# Patient Record
Sex: Female | Born: 1955 | Race: White | Hispanic: No | Smoking: Current every day smoker
Health system: Southern US, Community
[De-identification: ages and names within clinical notes are randomized; demographics above are authoritative.]

## PROBLEM LIST (undated history)

## (undated) DIAGNOSIS — M1990 Unspecified osteoarthritis, unspecified site: Secondary | ICD-10-CM

## (undated) DIAGNOSIS — R011 Cardiac murmur, unspecified: Secondary | ICD-10-CM

## (undated) DIAGNOSIS — E119 Type 2 diabetes mellitus without complications: Secondary | ICD-10-CM

## (undated) DIAGNOSIS — K259 Gastric ulcer, unspecified as acute or chronic, without hemorrhage or perforation: Secondary | ICD-10-CM

## (undated) DIAGNOSIS — F32A Depression, unspecified: Secondary | ICD-10-CM

## (undated) DIAGNOSIS — J302 Other seasonal allergic rhinitis: Secondary | ICD-10-CM

## (undated) HISTORY — PX: APPENDECTOMY: SHX54

## (undated) HISTORY — PX: CHOLECYSTECTOMY: SHX55

## (undated) HISTORY — PX: ABDOMINAL HYSTERECTOMY: SHX81

## (undated) HISTORY — PX: HERNIA REPAIR: SHX51

## (undated) HISTORY — PX: KNEE SURGERY: SHX244

## (undated) HISTORY — DX: Gastric ulcer, unspecified as acute or chronic, without hemorrhage or perforation: K25.9

---

## 2001-12-27 ENCOUNTER — Emergency Department (HOSPITAL_COMMUNITY): Admission: EM | Admit: 2001-12-27 | Discharge: 2001-12-27 | Payer: Self-pay | Admitting: *Deleted

## 2001-12-29 ENCOUNTER — Other Ambulatory Visit: Admission: RE | Admit: 2001-12-29 | Discharge: 2001-12-29 | Payer: Self-pay | Admitting: *Deleted

## 2001-12-30 ENCOUNTER — Ambulatory Visit (HOSPITAL_COMMUNITY): Admission: RE | Admit: 2001-12-30 | Discharge: 2001-12-30 | Payer: Self-pay | Admitting: *Deleted

## 2002-03-27 ENCOUNTER — Emergency Department (HOSPITAL_COMMUNITY): Admission: EM | Admit: 2002-03-27 | Discharge: 2002-03-27 | Payer: Self-pay | Admitting: Emergency Medicine

## 2002-03-27 ENCOUNTER — Ambulatory Visit (HOSPITAL_COMMUNITY): Admission: RE | Admit: 2002-03-27 | Discharge: 2002-03-27 | Payer: Self-pay | Admitting: *Deleted

## 2002-04-21 ENCOUNTER — Ambulatory Visit (HOSPITAL_COMMUNITY): Admission: RE | Admit: 2002-04-21 | Discharge: 2002-04-21 | Payer: Self-pay | Admitting: Family Medicine

## 2002-04-21 ENCOUNTER — Encounter: Payer: Self-pay | Admitting: Family Medicine

## 2002-05-21 ENCOUNTER — Encounter: Payer: Self-pay | Admitting: *Deleted

## 2002-05-21 ENCOUNTER — Inpatient Hospital Stay (HOSPITAL_COMMUNITY): Admission: RE | Admit: 2002-05-21 | Discharge: 2002-05-23 | Payer: Self-pay | Admitting: General Surgery

## 2003-10-07 ENCOUNTER — Emergency Department (HOSPITAL_COMMUNITY): Admission: EM | Admit: 2003-10-07 | Discharge: 2003-10-07 | Payer: Self-pay | Admitting: Emergency Medicine

## 2003-11-05 ENCOUNTER — Observation Stay (HOSPITAL_COMMUNITY): Admission: RE | Admit: 2003-11-05 | Discharge: 2003-11-06 | Payer: Self-pay | Admitting: General Surgery

## 2004-02-28 ENCOUNTER — Ambulatory Visit (HOSPITAL_COMMUNITY): Admission: RE | Admit: 2004-02-28 | Discharge: 2004-02-28 | Payer: Self-pay | Admitting: Family Medicine

## 2005-04-16 ENCOUNTER — Emergency Department (HOSPITAL_COMMUNITY): Admission: EM | Admit: 2005-04-16 | Discharge: 2005-04-16 | Payer: Self-pay | Admitting: Emergency Medicine

## 2006-11-30 ENCOUNTER — Emergency Department (HOSPITAL_COMMUNITY): Admission: EM | Admit: 2006-11-30 | Discharge: 2006-11-30 | Payer: Self-pay | Admitting: Emergency Medicine

## 2007-06-25 ENCOUNTER — Ambulatory Visit: Payer: Self-pay | Admitting: Internal Medicine

## 2007-07-09 ENCOUNTER — Encounter: Payer: Self-pay | Admitting: Internal Medicine

## 2007-07-09 ENCOUNTER — Ambulatory Visit (HOSPITAL_COMMUNITY): Admission: RE | Admit: 2007-07-09 | Discharge: 2007-07-09 | Payer: Self-pay | Admitting: Internal Medicine

## 2007-07-09 ENCOUNTER — Ambulatory Visit: Payer: Self-pay | Admitting: Internal Medicine

## 2007-08-06 ENCOUNTER — Ambulatory Visit (HOSPITAL_COMMUNITY): Admission: RE | Admit: 2007-08-06 | Discharge: 2007-08-06 | Payer: Self-pay | Admitting: General Surgery

## 2008-06-23 ENCOUNTER — Ambulatory Visit (HOSPITAL_COMMUNITY): Admission: RE | Admit: 2008-06-23 | Discharge: 2008-06-23 | Payer: Self-pay | Admitting: Family Medicine

## 2010-07-26 ENCOUNTER — Emergency Department (HOSPITAL_COMMUNITY)
Admission: EM | Admit: 2010-07-26 | Discharge: 2010-07-26 | Disposition: A | Discharge status: Home / Self Care | Payer: BLUE CROSS/BLUE SHIELD | Attending: Emergency Medicine | Admitting: Emergency Medicine

## 2010-07-26 ENCOUNTER — Emergency Department (HOSPITAL_COMMUNITY): Payer: BLUE CROSS/BLUE SHIELD

## 2010-07-26 DIAGNOSIS — M79609 Pain in unspecified limb: Secondary | ICD-10-CM | POA: Insufficient documentation

## 2010-07-26 DIAGNOSIS — M129 Arthropathy, unspecified: Secondary | ICD-10-CM | POA: Insufficient documentation

## 2010-07-26 DIAGNOSIS — M25539 Pain in unspecified wrist: Secondary | ICD-10-CM | POA: Insufficient documentation

## 2010-08-09 ENCOUNTER — Other Ambulatory Visit (HOSPITAL_COMMUNITY): Payer: Self-pay | Admitting: Family Medicine

## 2010-08-09 ENCOUNTER — Ambulatory Visit (HOSPITAL_COMMUNITY)
Admission: RE | Admit: 2010-08-09 | Discharge: 2010-08-09 | Disposition: A | Discharge status: Home / Self Care | Payer: BLUE CROSS/BLUE SHIELD | Source: Ambulatory Visit | Attending: Family Medicine | Admitting: Family Medicine

## 2010-08-09 DIAGNOSIS — Z0189 Encounter for other specified special examinations: Secondary | ICD-10-CM | POA: Insufficient documentation

## 2010-08-09 DIAGNOSIS — Z01419 Encounter for gynecological examination (general) (routine) without abnormal findings: Secondary | ICD-10-CM

## 2010-08-29 NOTE — Op Note (Signed)
NAME:  Ashley Serrano, Ashley Serrano               ACCOUNT NO.:  [id number]   MEDICAL RECORD NO.:  [id number]          PATIENT TYPE:  AMB   LOCATION:  DAY                           FACILITY:  [facility]   PHYSICIAN:  R. Roetta Sessions, M.D. DATE OF BIRTH:  1955-11-27   DATE OF PROCEDURE:  07/09/2007  DATE OF DISCHARGE:                               OPERATIVE REPORT   PROCEDURE:  Diagnostic esophagogastroduodenoscopy followed by  ileocolonoscopy with biopsy.   INDICATIONS FOR PROCEDURE:  A 55 year old lady with hemoccult positive  stool last year for which she was offered colonoscopy but failed to  follow through until now.  She has also had some intermittent epigastric  pain and H pylori infection previously treated.  EGD and colonoscopy are  now being done.  This approach has been discussed with the patient at  length.  Potential risks, benefits and alternatives have been reviewed,  questions answered.  She is agreeable.  Please see documentation in the  medical record.   PROCEDURE NOTE:  O2 saturation, blood pressure, pulse and respirations  were monitored throughout the entire procedure.   CONSCIOUS SEDATION:  Versed 8 mg IV, Demerol 125 mg IV in divided doses.  Cetacaine spray for topical pharyngeal anesthesia.   INSTRUMENT:  Pentax video chip system.   FINDINGS:  EGD:  Examination of tubular esophagus revealed noncritical  Schatzki's ring and tiny distal esophageal erosions.  Esophageal mucosa  was otherwise unremarkable.  The EG junction easily traversed.   Stomach:  Gastric cavity was emptied and insufflated well with air.  Thorough examination of the gastric mucosa including retroflexed view of  the proximal stomach and esophagogastric junction demonstrated only a  small hiatal hernia.  Pylorus was patent and easily traversed.  Examination of the bulb and second portion revealed a couple of tiny  bulbar erosions.  Otherwise the mucosa of D1 and D2 appeared entirely  normal.   THERAPEUTIC/DIAGNOSTIC MANEUVERS PERFORMED:  None.   The patient tolerated procedure well.   Colonoscopy:  Digital rectal exam revealed no abnormalities.   ENDOSCOPIC FINDINGS:  The prep was adequate.   Colon:  Colonic mucosa was surveyed from the rectosigmoid junction,  through the left, transverse and right colon to the area of appendiceal  orifice, ileocecal valve and cecum.  These structures were seen and  photographed for the record.  Terminal ileum was intubated to 5 cm.  From this level, the scope was slowly withdrawn.  All previously  mentioned mucosal surfaces were again seen.  The patient had a somewhat  tortuous elongated colon.  The patient had scattered left-sided  diverticula.  There was a 2 mm polyp at the base of the cecum which was  cold biopsy/removed.  Remainder of the colon mucosa appeared  unremarkable.  Scope was pulled down in the rectum where thorough  examination of the rectal mucosa including retroflexed view of the anal  verge demonstrated the anal papilla and some internal hemorrhoids only.  The patient tolerated both procedures well as reactive to endoscopy.   IMPRESSION:  1. Tiny distal esophageal erosions consistent with mild erosive reflux  esophagitis, noncritical Schatzki's ring, not manipulated otherwise      unremarkable esophagus.  2. Small hiatal hernia, otherwise normal stomach, patent pylorus, a      couple of bulbar erosions.  Otherwise D1 and D2 appeared normal.  3. Colonoscopy findings:  Anal papilla and internal hemorrhoids,      otherwise normal rectum, left-sided diverticula, diminutive cecal      polyp status post cold biopsy removal.  Remainder of colonic mucosa      and terminal ileal mucosa appeared normal.   RECOMMENDATIONS:  1. Begin Prilosec 20 mg orally daily for gastroesophageal reflux      disease.  Reflux, hiatal hernia and diverticulosis and  polyp      literature provided Ms. Melgoza.  2. Follow-up on path.  3.  Further recommendations to follow.      Jonathon Bellows, M.D.  Electronically Signed     RMR/MEDQ  D:  07/09/2007  T:  07/09/2007  Job:  [id number]

## 2010-08-29 NOTE — Op Note (Signed)
NAME:  Ashley Serrano, WIMBISH NO.:  [id number]   MEDICAL RECORD NO.:  [id number]          PATIENT TYPE:  AMB   LOCATION:  DAY                           FACILITY:  [facility]   PHYSICIAN:  Tilford Pillar, MD      DATE OF BIRTH:  03-Jul-1955   DATE OF PROCEDURE:  08/06/2007  DATE OF DISCHARGE:                               OPERATIVE REPORT   PREOPERATIVE DIAGNOSIS:  Recurrent incisional hernia.   POSTOPERATIVE DIAGNOSIS:  Recurrent incisional hernia.   PROCEDURE:  Laparoscopic herniorrhaphy with mesh (15 x 20 cm Proceed  mesh).   SURGEON:  Tilford Pillar, MD   ANESTHESIA:  General endotracheal, local anesthetic 1% Sensorcaine  plain.   SPECIMEN:  None.   ESTIMATED BLOOD LOSS:  Scant.   INDICATIONS:  The patient is a 55 year old female who presented to my  office as an outpatient with a notable bulging above her umbilicus.  She  had had a previous midline incision and had developed a hernia.  This  had previously been repaired by another surgeon with the utilization of  a mesh from an anterior approach.  This was approximately 2 years ago.  She states she did well following, but then had over the last several  months, again noted some bulging in this area.  The risks, benefits, and  alternatives of a laparoscopic versus open anterior hernia repair were  discussed with the patient at length, including but not limited to the  risk of bleeding, infection, infection of the mesh, as well as  possibility of underlying bowel injury, as well as possibility of  intraoperative pulmonary or cardiac event.  The patient's questions and  concerns were addressed, and the patient was consented for a planned  laparoscopic ventral recurrent incisional hernia repair.  The patient is  aware that this typically does have more postoperative pain than an  anterior approach, but understands the benefits of the procedure as well  and a recurrent incisional hernia.   OPERATION:  The patient was  taken to the operating room and was placed  in a supine position on the operating table, at which time the general  anesthetic was administered.  Once the patient was asleep, she was  endotracheally intubated by anesthesia.  At this point, the patient's  abdomen was prepped and draped in the usual fashion.  A stab incision  was created at the left upper quadrant and the left subcostal margin at  Palmer's point.  A Veress needle was inserted.  Saline drop test was  utilized to confirm intraperitoneal placement, and then pneumoperitoneum  was initiated.  Once sufficient pneumoperitoneum was obtained, a left  lateral 11-mm trocar was placed, creating a skin incision and placement  of the trocar over the laparoscope allowing visualization of trocar  entering into the peritoneal cavity.  At this point, the inner cannula  was removed.  The laparoscope was reinserted.  There was no evidence of  any Veress or trocar placement injury, and at this time, a 5-mm trocar  was placed to the superior umbilical side.  A stab incision and  placement of  trocar under direct visualization, a 30-degree scope was  utilized for allowing re-visualization of the abdominal wall.  There  were several multiple adhesions along the anterior aspect, especially in  the midline of the abdomen.  There were also multiple adhesions in the  lower abdomen, which were well away from the suspected hernia, and these  were left well alone.  At this point, a combination of blunt and sharp  dissection with an EndoShears and Harmonic scalpel dissection was  utilized to dissect the omental adhesions off the anterior abdominal  wall.  There was a loop of transverse colon also adherent.  This was  sharply dissected free without any difficulty and without any injury.  With continued dissection, mesh exposure was created on the anterior  abdominal wall throughout positioning of a third trocar.  This was a 5-  mm trocar, which was placed in  the right lateral abdominal wall.  This  was placed similarly with a skin incision and placed in the trocar under  direct visualization.  At this point, continued dissection was  undertaken until the hernia defect was circumferentially __________ and  a portion of the falciform ligament was also dissected allowing  additional area for planned pexy of the mesh to the anterior abdominal  wall.  With adequate circumferential clearance around the hernia,  palpation of the abdominal wall was utilized to mark the plain sites of  pexy sutures.  These were placed in the four quadrants around the hernia  defect.  The pneumoperitoneum was then evacuated allowing accurate  measurement, the measurement was approximately 19 cm in a caudal to the  cranial direction and roughly 15 cm in a left-to-right lateral  direction.  At this point, a pneumoperitoneum was reestablished.  A 20 x  15 cm piece of __________ mesh was brought to the field, 2-0 novafil  silk suture was utilized with the placement of pexy sutures at the four  quadrants of the mesh, the mesh was marked with appropriate orientation  for the placement with a marking pen and then was rolled in and inserted  through the 11-mm trocar site.  At this point, the laparoscope was  reinserted.  The mesh was positioned using a stab incision in the  previously marked site.  The EndoClose suture passing device was  utilized to pass the suture in the peritoneal cavity through the  abdominal wall.  These sutures were tagged with a hemostat.  Once all 4  pexy sutures were placed through the anterior abdominal wall, the  sutures were pulled  bringing the mesh against the anterior abdominal  wall, had excellent coverage over the hernia defect.  However, the mesh  was slightly loose in a caudal-to-cranial direction.  Therefore, the  inferior pexy sutures were again pulled back to the anterior abdominal  wall and repositioned approximately 2 cm more inferiorly.   Again, passed  past the anterior abdominal wall with the EndoClose suture passing  device.  With these sutures in place, they were again pulled __________  and the  mesh now had excellent position, I was quite pleased with  orientation and the pexy sutures were inserted.  At that time, a spiral  __________  catheter was brought to the field and was utilized to tack  the mesh circumferentially to the anterior abdominal wall, and the mesh  was in excellent position.  Hemostasis was excellent.  Pneumoperitoneum  was evacuated.  Trocars were removed.  Local anesthetic was instilled,  and then a 4-0  Monocryl was utilized to reapproximate the skin edges at  the three trocar sites and was then washed and dried with a moist dry  towel.  Benzoin was applied around all stab incisions, including the  trocar sites.  Half-inch Steri-Strips were placed over all incisions.  The drapes were removed.  The patient was allowed to come out of general  anesthetic.  She was transferred back to regular hospital bed.  She was  transferred to Postanesthetic Care Unit in stable condition.  At the  conclusion of the procedure, all instruments, sponges, and needle counts  were correct.  The patient tolerated the procedure well.      Tilford Pillar, MD  Electronically Signed     BZ/MEDQ  D:  08/06/2007  T:  08/07/2007  Job:  [id number]   cc:   Kirk Ruths, M.D.  Fax: [phone]

## 2010-08-29 NOTE — H&P (Signed)
NAME:  Ashley Serrano, Ashley Serrano NO.:  [id number]   MEDICAL RECORD NO.:  [id number]          PATIENT TYPE:  AMB   LOCATION:  DAY                           FACILITY:  [facility]   PHYSICIAN:  Tilford Pillar, MD      DATE OF BIRTH:  July 18, 1955   DATE OF ADMISSION:  DATE OF DISCHARGE:  LH                              HISTORY & PHYSICAL   CHIEF COMPLAINT:  Abdominal hernia.   HISTORY OF PRESENT ILLNESS:  The patient is a 55 year old female who  presented to my office as an outpatient with a notable bulge, just above  her umbilicus.  She has had multiple abdominal surgeries in the past and  has actually had a prior incisional hernia at the same site, which was  repaired from an anterior approach with mesh, approximately 2 years ago.  She states she had done well prior to this but then noted a slow  increase in the size of the bulging in this area again.  She has had no  change in bowel function.  She has had no fever or chills.  She has had  no signs or symptoms consistent with incarceration or strangulation.  Again, this has been increasing slowly in size over the last one year.  She has not noticed any other bulges or masses.   PAST MEDICAL HISTORY:  1. Gastroesophageal reflux disease.  2. Diagnosed hiatal hernia.   PAST SURGICAL HISTORY:  1. Open cholecystectomy.  2. Hysterectomy.  3. Multiple abdominal hernia repairs.  4. Open appendectomy.   MEDICATIONS:  She is on omeprazole.   ALLERGIES:  No known drug allergies.   SOCIAL HISTORY:  She is a 1-pack per day smoker.  She denies any alcohol  or recreational drug use.   OCCUPATION:  She works as a Armed forces logistics/support/administrative officer at [employer].   FAMILY HISTORY:  No significant pertinent family medical history is  obtained.   REVIEW OF SYSTEMS:  CONSTITUTIONAL:  Unremarkable.  EYES:  Unremarkable.  EARS, NOSE, AND THROAT:  Occasional rhinorrhea, especially with seasonal  allergies.  RESPIRATORY:  Unremarkable.  CARDIOVASCULAR:   Unremarkable.  GASTROINTESTINAL:  Positive for heartburn, otherwise unremarkable.  GENITOURINARY:  Unremarkable.  MUSCULOSKELETAL:  Unremarkable.  SKIN:  Unremarkable.  ENDOCRINE:  She does have occasional heat intolerance.  NEURO:  Unremarkable.   PHYSICAL EXAMINATION:  GENERAL:  The patient is an obese, somewhat  disheveled female, in no acute distress.  She is alert and oriented x3.  HEENT:  Scalp, no deformities, no masses.  Eyes, pupils equal, round and  reactive.  Extraocular movements intact.  No scleral icterus or  conjunctival pallor is noted.  No diminished hearing is appreciated on  examination today.  Nose, no deformities.  Oral mucosa, pink.  NECK:  Trachea is midline.  No thyroid nodules are apparent.  No  cervical lymphadenopathy is appreciated.  PULMONARY:  Unlabored respiration.  She is clear to auscultation in  bilateral lung fields.  CARDIOVASCULAR:  Regular rate and rhythm.  No murmurs or gallops.  She  has 2+ radial pulses bilaterally.  ABDOMINAL EXAM:  Positive  bowel sounds.  Abdomen is soft, nontender.  She does have a reducible midline hernia, just superior to the umbilicus  at the inferior portion of a previous midline upper abdominal incision.  This is easily reducible.  SKIN:  Warm and dry.   ASSESSMENT AND PLAN:  Recurrent incisional hernia, which is reducible.  At this point, the risks, benefits, and alternatives of herniorrhaphy  were discussed at length with the patient including but not limited to  risk of infection, bleeding, bowel injury as well as the possibility of  mesh infection with the need for removal of the mesh as well as the  possibility of intraoperative pulmonary or cardiac events.  At this  point, it was also discussed at length with the patient, the advantages  of laparoscopic versus open, and it was discussed with the patient as  she has failed a prior anterior approach, that a laparoscopic approach  may offer additional benefits at  this time although she is aware of the  increased pain associated with the abdominal wall stapling of the mesh.  She also was aware that due to her several other abdominal surgeries  that a laparoscopic surgery may not be possible, and that the initial  attempts may demonstrate excessive intra-abdominal adhesions, limiting  the benefit of a laparoscopic repair.  It was discussed with the patient  that this will likely increase the length of time to do the repair;  however, again should provide her with an excellent repair should it be  safe and possible through the laparoscope.  She understands the risks  and does wish to proceed with a laparoscopic, understandably, possible  open hernia repair with mesh.      Tilford Pillar, MD  Electronically Signed     BZ/MEDQ  D:  07/25/2007  T:  07/26/2007  Job:  [id number]   cc:   Kirk Ruths, M.D.  Fax: [phone]   Short Stay Surgery

## 2010-08-29 NOTE — Consult Note (Signed)
NAME:  Ashley Serrano, Ashley Serrano               ACCOUNT NO.:  [id number]   MEDICAL RECORD NO.:  [id number]          PATIENT TYPE:  AMB   LOCATION:  DAY                           FACILITY:  [facility]   PHYSICIAN:  R. Roetta Sessions, M.D. DATE OF BIRTH:  [birth year]   DATE OF CONSULTATION:  DATE OF DISCHARGE:                                 CONSULTATION   REASON FOR CONSULTATION:  Hemoccult positive stools, colonoscopy.   PHYSICIAN REQUESTING CONSULTATION:  Kirk Ruths, M.D.   HISTORY OF PRESENT ILLNESS:  Ashley Serrano is a 55 year old lady who presents  today for further evaluation of Hemoccult positive stools and  consideration of colonoscopy. She went for an upper respiratory tract  infection to see Dr. Regino Schultze about two weeks ago. She states she also  mentioned to him that she has been having some diarrhea for a couple  weeks. It occurred to them that she had never followed through with  getting a colonoscopy as recommended last year when she was Hemoccult  positive. This is around the time she had Campylobacter in a stool  culture in January 2008. She said she did get over that diarrhea episode  up until four weeks ago when she felt like she got a stomach virus. She  has had diarrhea ever since. Other people around her have been sick as  well. She denies any obvious blood in her stools, but she says it is  hard to tell sometimes. She has postprandial fecal urgency and stool  varies significantly in color. She only has a couple of stools a day.  Denies any abdominal pain currently but she does have some intermittent  epigastric pain. A couple of years ago she was told she had an ulcer and  was treated for H. pylori, although she only completed about seven days  of therapy. She denies any NSAID use or aspirin use stating that it  makes her stomach hurt. She continues to have episodes of intermittent  epigastric pain. Denies any early satiety, nausea, vomiting, heartburn,  however. She has never had an  EGD or colonoscopy.   MEDICATIONS:  1. She is currently on Xanax 0.25 mg every 6 hours as needed.  2. Tylenol Arthritis p.r.n.   ALLERGIES:  No known drug allergies.   PAST MEDICAL HISTORY:  1. Recurrent ventral hernia. She had this repaired by Dr. Katrinka Blazing and      Dr. Malvin Johns previously but is now scheduled to see Dr. Lovell Sheehan to      have it fixed.  2. She has also had a hysterectomy, cholecystectomy, and appendectomy.  3. She was treated for H. pylori a few years ago, completed about      seven days of Helidac therapy. No prior EGD or colonoscopy.   FAMILY HISTORY:  Mother is alive, has a rare blood disorder. Father  died due to farm accident at age 58. No family history of colorectal  cancer or chronic gastrointestinal illnesses.   SOCIAL HISTORY:  She is single. She has two children. She works at  [employer]. She smokes one pack of cigarettes  daily, has  smoked since age 39. She denies any alcohol use.   REVIEW OF SYSTEMS:  See HPI for GI.  CONSTITUTIONAL:  No weight loss.  CARDIOPULMONARY:  No chest pain or shortness of breath. GENITOURINARY:  No dysuria or hematuria.   PHYSICAL EXAMINATION:  Temperature 97.8, pulse 76, blood pressure  128/82, weight 221.5 pounds, height 5 feet 7 inches.  GENERAL:  Pleasant, obese, Caucasian female in no acute distress.  SKIN:  Warm, dry, no jaundice.  HEENT:  Sclerae nonicteric. Oropharyngeal mucosa moist and pink, no  lesions, erythema, or exudate. No lymphadenopathy or thyromegaly.  CHEST:  Clear to auscultation.  CARDIAC:  Regular rate and rhythm. Normal S1, S2. No murmurs, rubs, or  gallops.  ABDOMEN:  Positive bowel sounds. Abdomen is obese but symmetrical, soft.  Mild epigastric tenderness to deep palpation. No rebound or guarding. No  hepatosplenomegaly or masses. No abdominal bruits. She has approximately  walnut-sized ventral hernia easily reducible, nontender.  LOWER EXTREMITIES:  No edema.   LABS:  From  January 2008, her lipase was elevated at 99, amylase 64,  LFTs were normal. August 2008 her LFTs were normal. Her lipase was  normal at 25.   IMPRESSIONS:  The patient is a 55 year old lady who presents primarily  to schedule colonoscopy. She had Hemoccult positive stools last year at  which time she also had Campylobacter. She has had diarrhea for about  four weeks. Initially she felt it was a viral gastroenteritis, but  symptoms continue on. She has not seen any overt gastrointestinal  bleeding. She has never had a colonoscopy. In addition, she does have  intermittent epigastric pain with history of previous partial treatment  for H. pylori as outlined above. Doubt we are dealing with peptic ulcer  disease; however, because of history of recurrent epigastric pain and  Hemoccult positive stools, I would offer an EGD at the same time as  colonoscopy. Notably as above she did have a minimally elevated lipase  last year, but back in August it was normal. With recurrent epigastric  pain, cannot exclude possibility of mild pancreatitis; however, I doubt  this given only a mild increase in lipase less than 2 times normal.   PLAN:  1. Esophagogastroduodenoscopy and colonoscopy with Dr. Jena Gauss in the      near future.   INCOMPLETE DICTATION      Tana Coast, P.AJonathon Bellows, M.D.  Electronically Signed    LL/MEDQ  D:  06/25/2007  T:  06/25/2007  Job:  [id number]

## 2010-09-01 NOTE — Op Note (Signed)
NAME:  Ashley Serrano, Ashley Serrano                         ACCOUNT NO.:  [id number]   MEDICAL RECORD NO.:  [id number]                   PATIENT TYPE:  AMB   LOCATION:  DAY                                  FACILITY:  [facility]   PHYSICIAN:  Roylene Reason. Lisette Grinder, M.D.             DATE OF BIRTH:  [birth year]   DATE OF PROCEDURE:  DATE OF DISCHARGE:                                 OPERATIVE REPORT   PREOPERATIVE DIAGNOSES:  1. Abnormal uterine bleeding.  2. Anemia secondary to blood loss.   POSTOPERATIVE DIAGNOSES:  1. Abnormal uterine bleeding.  2. Anemia secondary to blood loss.   PROCEDURE:  Dilatation and curettage.   SURGEON:  Roylene Reason. Lisette Grinder, M.D.   ESTIMATED BLOOD LOSS:  Less than 100 cc.   ANESTHESIA:  General with bag and mask ventilation.   SPECIMENS:  Endometrial curettings to pathology for permanent section only.  Moderate amount of normal appearing tissue obtained.   FINDINGS:  Findings at the time of surgery include minimal uterine descensus  with gentle traction.  There is a moderate cystocele present.  The uterus is  noted to be acutely anteflexed and sounds to be a depth of 10 cm.   DESCRIPTION OF PROCEDURE:  The patient was taken to the operating room and  vital signs were stable. The patient was placed in the low lithotomy  position after being introduced under general anesthesia.  She was prepped  and draped in the usual sterile manner.  Sterile speculum examination  performed.  Cervix was noted to be free of all lesions.   The anterior lip of the cervix was grasped with a single-tooth tenaculum.  The uterus was sounded to a depth of 10 cm.  Progressive dilatation was then  performed up to a size #27 dilator though which multiple passes with a banjo  curette through the endocervical os and into the endometrial cavity.  Initially a large amount of tissue was present particularly in the uterine  fundus which acquired aggressive curettage.  Aggressive curettage was  continued of the entire uterine cavity until a fine gritty sensation was  appreciated in all quadrants of the uterus to include the uterine fundus,  and no further tissue obtained.  There was no evidence of any large  leiomyoma or any intrauterine problems.   The procedure was then terminated.  The patient was reversed of anesthesia  and taken to the recovery room in stable condition.  Operative findings were  discussed with the patient's awaiting family.  The patient will also be  discharged on 12/30/01.                                               Donald P. Lisette Grinder, M.D.    DPC/MEDQ  D:  12/30/2001  T:  12/31/2001  Job:  [id number]

## 2010-09-01 NOTE — Op Note (Signed)
   NAME:  Ashley Serrano, Ashley Serrano                         ACCOUNT NO.:  [id number]   MEDICAL RECORD NO.:  [id number]                   PATIENT TYPE:  AMB   LOCATION:  DAY                                  FACILITY:  [facility]   PHYSICIAN:  Langley Gauss, M.D.                DATE OF BIRTH:  [birth year]   DATE OF PROCEDURE:  03/27/2002  DATE OF DISCHARGE:  03/27/2002                                 OPERATIVE REPORT   PREOPERATIVE DIAGNOSES:  1. Abnormal uterine bleeding.  2. Anemia secondary to blood loss.   PROCEDURE:  Dilatation and curettage.   SURGEON:  Langley Gauss, M.D.   ESTIMATED BLOOD LOSS:  Minimal.   SPECIMENS:  Large amounts of uterine curettings to pathology for permanent  section only.   ANESTHESIA:  LMA per anesthesia.   DESCRIPTION OF PROCEDURE:  The patient was taken to the operating room and  vital signs were stable.  The patient underwent uncomplicated induction of  general anesthesia, after which time she was placed in the low lithotomy  position, prepped and draped in the usual sterile manner.  She was noted to  have some active vaginal bleeding occurring from the cervix.  Sterile  speculum examination reveals no cervical lesions.  The uterus is noted to  sound to a depth of 11 cm.  Progressive dilatation is then performed down to  a size #17 dilator, which allows passage of a small banjo curette through  the uterine cervix and into the uterus itself.  Aggressive curettage is then  performed of the entire uterine cavity to include the uterine fundus, with  findings of moderate amount of normal-appearing tissue.  There was some  irregular contour in the posterior vaginal wall, which would be consistent  with leiomyoma.  Curettage was continued until a gritty sensation is  appreciated in all quadrants of the uterus, to include the uterine fundus,  and minimal further tissue is obtained.  The specimen is handed off on  [place].  The procedure is then terminated.  The patient  is reversed with  anesthesia, taken to the recovery room in stable condition, at which time  operative findings discussed with the patient's awaiting family.                                               Langley Gauss, M.D.    DC/MEDQ  D:  03/31/2002  T:  04/01/2002  Job:  [id number]

## 2010-09-01 NOTE — H&P (Signed)
NAME:  Ashley Serrano, Ashley Serrano                         ACCOUNT NO.:  [id number]   MEDICAL RECORD NO.:  [id number]                   PATIENT TYPE:  AMB   LOCATION:  DAY                                  FACILITY:  [facility]   PHYSICIAN:  Dirk Dress. Katrinka Blazing, M.D.                DATE OF BIRTH:  20-Mar-1956   DATE OF ADMISSION:  11/04/2003  DATE OF DISCHARGE:                                HISTORY & PHYSICAL   HISTORY OF PRESENT ILLNESS:  A 55 year old female with history of enlarging  ventral hernia. This has been present for over a year. She has mild pain.  She has no gastrointestinal symptoms. She is scheduled for ventral hernia  repair.   PAST MEDICAL HISTORY:  She has chronic anemia and depression.   MEDICATIONS:  Ferrous sulfate and estrogen replacement.   PAST SURGICAL HISTORY:  Appendectomy, tubal ligation, hysterectomy, and open  cholecystectomy.   ALLERGIES:  None.   FAMILY HISTORY:  Positive for hypertension, atherosclerotic heart disease,  stroke, deep vein thrombosis, and depression.   SOCIAL HISTORY:  She is divorced, employed. She is a Armed forces logistics/support/administrative officer. She has  2 children. She smokes 1 pack per day of cigarettes.   PHYSICAL EXAMINATION:  VITAL SIGNS:  Blood pressure 120/80, pulse 88,  respiratory rate 18, weight 204 pounds.  HEENT:  Unremarkable.  NECK:  Supple without jugular venous distention or bruit.  CHEST:  Clear to auscultation.  HEART:  Regular rate and rhythm. Without murmur, rub, or gallop.  ABDOMEN:  Large ventral hernia with incarceration into that. Moderate  tenderness.  EXTREMITIES:  No clubbing, cyanosis, or edema.  NEUROLOGIC:  No focal, motor, sensory or cerebellar deficits.   IMPRESSION:  1. Incarcerated ventral hernia.  2. Chronic anemia.  3. Depression.   PLAN:  Ventral hernia repair with mesh graft.     ___________________________________________                                         Dirk Dress. Katrinka Blazing, M.D.   LCS/MEDQ  D:  11/04/2003  T:  11/05/2003   Job:  [id number]

## 2010-09-01 NOTE — Discharge Summary (Signed)
   NAME:  Ashley Serrano, Ashley Serrano                         ACCOUNT NO.:  [id number]   MEDICAL RECORD NO.:  [id number]                   PATIENT TYPE:  INP   LOCATION:  A427                                 FACILITY:  [facility]   PHYSICIAN:  Langley Gauss, M.D.                DATE OF BIRTH:  January 19, 1956   DATE OF ADMISSION:  05/21/2002  DATE OF DISCHARGE:  05/23/2002                                 DISCHARGE SUMMARY   DIAGNOSES:  1. Irregular menses.  2. Menorrhagia.  3. Umbilical hernia.   PROCEDURE PERFORMED:  1. TAH/BS&O by Dr.Donald P. Lisette Grinder.  2. Incisional hernia repair from umbilical hernia, repaired by Dr. Barbaraann Barthel with Dr. Langley Gauss assisting.   DISPOSITION:  The patient is to follow up in the office in four days time  for removal of staples from the midline abdominal incision.  JP drain is  removed prior to discharge.   DISCHARGE INSTRUCTIONS:  The patient was given a copy of the standard  discharge instructions at time of discharge.   PERTINENT LABORATORY STUDIES:  On admission, hemoglobin and hematocrit  12.2/37.2 with a white count of 6.2.  On postoperative day #1, hemoglobin  9.9, hematocrit 29.7, with white count 7.5.  Serum hCG is negative.  A  negative blood type.  Electrolytes and liver function tests within normal  limits.   HOSPITAL COURSE:  The patient admitted May 21, 2002.  The TAH/BS&O was  performed without complications.  I then assisted Dr. Barbaraann Barthel in  the repair of the umbilical hernia.  Postoperatively the patient did well.  She ambulated well after removal of the Foley catheter, gradually was able  to increase her p.o. intake such that she had resumption of bowel function.  On May 23, 2002 the patient had resumption of bowel function, was  passing flatus, and tolerating p.o. intake to include p.o. pain medication.  The patient did have a Climara patch in place which was placed in the  recovery room.  The patient had no more  complaints of hot flashes and did  well, to be discharged on May 23, 2002.                                               Langley Gauss, M.D.    DC/MEDQ  D:  05/26/2002  T:  05/26/2002  Job:  [id number]

## 2010-09-01 NOTE — Op Note (Signed)
NAME:  Ashley Serrano, Ashley Serrano                         ACCOUNT NO.:  [id number]   MEDICAL RECORD NO.:  [id number]                   PATIENT TYPE:  INP   LOCATION:  A427                                 FACILITY:  [facility]   PHYSICIAN:  Langley Gauss, M.D.                DATE OF BIRTH:  04/12/1956   DATE OF PROCEDURE:  05/21/2002  DATE OF DISCHARGE:                                 OPERATIVE REPORT   PREOPERATIVE DIAGNOSES:  1. Menometrorrhagia.  2. Irregular menses.  3. Dysmenorrhea.   POSTOPERATIVE DIAGNOSES:  1. Menometrorrhagia.  2. Irregular menses.  3. Dysmenorrhea.   PROCEDURE:  1. Total abdominal hysterectomy with bilateral salpingo-oophorectomy.  2. Umbilical hernia repair is performed by Dr. Barbaraann Barthel with Dr.     Lisette Grinder present and assisting for the repair.   SURGEON:  Roylene Reason. Lisette Grinder, M.D.   ANESTHESIA:  General endotracheal.   SPECIMENS:  Permanent pathology only   ESTIMATED BLOOD LOSS:  350 mL   COMPLICATIONS:  None.   DRAINS:  JP catheter in subcu space, Foley catheter to straight drainage.   FINDINGS AT THE TIME OF SURGERY:  Include a diffusely enlarged, bulbous  uterus with a prominent fundus.  There are note to be atrophic appearing  ovaries bilaterally.  In addition umbilical hernia repair as described by  Dr. Malvin Johns.   DESCRIPTION OF PROCEDURE/SUMMARY:  The patient was taken to the operating  room.  Vital signs were stable.  Patient underwent uncomplicated induction  of general endotracheal anesthesia after which time she was prepped and  draped in the usual sterile manner.  Foley catheter was placed to straight  drainage.  A sharp knife was then used to incise a midline abdominal  incision, dissecting down to the fascial plane utilizing a sharp knife, and  cauterizing bleeders along the way.  The fascia was then incised in a  vertical manner utilizing sharp dissection with the Mayo scissors while  sharply dissecting off the underlying rectus  muscles.  The rectus muscles  were bluntly separated in the midline.  The peritoneal cavity was  atraumatically and bluntly entered at the superior-most portion of the  incision, peritoneal incision then extended superiorly and inferiorly.  Inferiorly we directly visualized the bladder to avoid its accidental entry  and entry.  The pelvic contents are described previously.   The patient placed in Trendelenburg position, a Balfour self-retaining  retractor was placed.  Three moist packs were used to mobilize bowel of out  of the operative field.  Bladder blade was also placed.  Long straight  Kocher clamps were used to grasp the specimen at the junction of the  round  ligament and fallopian tube at the uterus itself.  This allows manipulation  of the specimen throughout the operative procedure.  The specimen as  previously described.  The right round ligament is identified.  Kelly clamp  is placed to control back bleeding  and #0 Vicryl in a Heaney fashion is used  to secure the right round ligament which was then transected with the Mayo  scissors.  The infundibulopelvic ligament on the right is then skeletonized  in an avascular plane.  Ureters are noted in their normal anatomic position  along the lateral pelvic sidewall.   The infundibulopelvic ligament was then doubly clamped with Kelly clamps  very close to the ovary itself.  It is then doubly ligated, first with free  tie of #0 Vicryl, followed by suture ligature of #0 Vicryl in a Heaney  fashion.  Hemostasis was assured.   I then turned my attention for the left side, at which time the left round  ligament was clamped with a Kelly clamp for back bleeding, and #0 Vicryl in  a Heaney fashion is used to secure the left round ligament,  It is then  transected and skeletization is likewise performed of the infundibulopelvic  ligament on the left and then doubly clamped with straight Kelly clamps and  doubly ligated first with a free  tie of #0 Vicryl followed by a suture  ligature of #0 Vicryl in a Heaney fashion to secure hemostasis.   Following this I dissected in the avascular plane of the broad ligament.  Dissecting in the avascular plan of the broad ligament I was able to  skeletonize the uterine vessels bilaterally.  I then continued the  dissection across the anterior lower uterine segment in the avascular plane  to create a bladder flap in the vesicouterine fold which was then bluntly  separated off the anterior lower uterine segment. A Kelly clamp was used to  clamp the right uterine vessel to control back bleeding followed by a suture  ligature of #0 Vicryl in a simple fashion after clamping the right uterine  vessel with a curved Heaney clamp.  This assured hemostasis on the right.   The left uterine vessel was likewise identified and handled in a likewise  manner with a curved Heaney clamp placed followed by a suture ligature of #0  Vicryl in a simple fashion. This results in the major vascular supply to the  uterus being secured. At this time I was certain that the bladder flap had  been dissected distal to the cervix and thus out of harms way.  Each of the  cardinal ligaments were transected separately with a straight Heaney clamp,  followed by a suture ligature of #0 Vicryl in a Heaney fashion.  The right  is secured first followed by the left.   A second straight Heaney clamp is required on each side to secure the  ureterosacral ligaments.  Each of these is likewise ligated with #0 Vicryl  in a Heaney fashion. This then brought me down to the vaginal angle.  The  right vaginal angle was identified, clamped with a curved Heaney clamp,  followed by suture ligature of #0 Vicryl in a Heaney fashion which was then  tagged for later inspection.  Left vaginal angle likewise identified, curved  Heaney clamp was placed, followed by suture ligature of #0 Vicryl in a Heaney fashion; again, this was tagged for  later inspection. This then  allows me to transect across the vaginal cuff, maintaining a maximal vaginal  length while removing the entirety of the cervix with the specimen itself.   The edges of the vaginal cuff were then grasped with straight Kocher clamp.  Irrigation is then performed with a Betadine solution; and the vaginal cuff  is then closed utilizing #0 Vicryl suture in a figure-of-eight fashion.  A  total of 3 of these were required to secure hemostasis. This results in  excellent hemostasis within the pelvic cavity. All of our pedicles are  examined and noted to be secured with no active bleeding.  Copious  irrigation is then performed until clear.  Balfour retractor is then  removed. Sponge, needle, and instrument counts were performed, but we were  noted to be short one 4 x 4 sponge.  Numerous attempts are made to account  for the abnormal count, but were unsuccessful.  Thus this will be dealt with  following the umbilical hernia repair.   By reaching through the midline abdominal incision up to the level of the  umbilicus I was able to identify the defect in the fascia which required  repair.  I then did remain in the operating room and first assist Dr.  Malvin Johns in the repair of this hernia.  Following complete repair of the  hernia irrigation is then again performed of the pelvic cavity to assure  hemostasis.  Again, we were not able to account for incorrect count with a  single 4 x 4 sponge missing. Thus an x-ray was performed on the patient  which pertinently did not show evidence of any sponge within the patient's  pelvic cavity.   Thus the closure was continued.  The peritoneal edges were grasped using  Kelly clamps; and the peritoneum was closed with a continuous running #0  chromic suture.  The overlying rectus muscle was then likewise closed  utilizing a continuous running #0 chromic suture in a simple running  fashion.  The fascial edges were then identified.  A  #1 PDS looped suture  was used to completely close the fascia.  Subcutaneous bleeders were then  cauterized.  A JP drain was placed in the subcutaneous space with a separate  exit wound to the left apex of the incision.  A JP drain is placed within  this and is secured in place with a separate suture.  Three interrupted #1  Maxon sutures were then placed through-and-through the skin edges to  facilitate closure of the skin incision in the midline.  The skin is then  completely closed utilizing skin staples.  Following this 30 mL of 0.5%  bupivacaine plain is injected along the entirety of the skin incision to  facilitate postoperative analgesia.  The patient tolerated the procedure  very well.  She was then extubated and taken to the recovery room in stable  condition.  She continues to drain clear yellow urine.  Operative findings  were discussed with the patient's awaiting family.                                              Langley Gauss, M.D.    DC/MEDQ  D:  05/22/2002  T:  05/22/2002  Job:  [id number]

## 2010-09-01 NOTE — Discharge Summary (Signed)
   NAME:  Ashley Serrano, Ashley Serrano                         ACCOUNT NO.:  [id number]   MEDICAL RECORD NO.:  [id number]                   PATIENT TYPE:  AMB   LOCATION:  DAY                                  FACILITY:  [facility]   PHYSICIAN:  Langley Gauss, M.D.                DATE OF BIRTH:  1955-11-27   DATE OF ADMISSION:  03/27/2002  DATE OF DISCHARGE:  03/27/2002                                 DISCHARGE SUMMARY   DISCHARGE INSTRUCTIONS:  Given a copy of discharge instructions.  Follow up  in the office in one week's time.   DISCHARGE MEDICATIONS:  1. Hemocyte-F, #30 with one refill.  2. Vicoprofen for relief of postoperative cramping.   LABORATORY DATA:  As stated previously, hemoglobin 9.5, hematocrit 28.8,  with a white count of 8.0.  HCG is negative.   HOSPITAL COURSE:  See previous dictations.  The patient was seen in the  emergency room, at which time she was scheduled and taken to the operating  room, where the dilatation and curettage was performed for immediate relief  of bleeding only.  Postoperatively the patient did well.  She had minimal  bleeding, and vital signs remained stable.  She was thus discharged to home  on date of service.  She is noted to be chronically anemia and will tolerate  this relative anemia very well and if her bleeding can be controlled, iron  replacement therapy would be very beneficial.  The patient was again advised  that this is not to be considered definitive therapy, and she will require  serial Provera withdrawals in an effort to induce cyclable menses.  If,  however, we are unsuccessful with serial Provera withdrawals, the patient  certainly has some pathology present and would benefit from a hysterectomy.                                               Langley Gauss, M.D.    DC/MEDQ  D:  03/31/2002  T:  04/01/2002  Job:  [id number]

## 2010-09-01 NOTE — H&P (Signed)
NAME:  Ashley Serrano, Ashley Serrano                         ACCOUNT NO.:  [id number]   MEDICAL RECORD NO.:  [id number]                   PATIENT TYPE:  AMB   LOCATION:  DAY                                  FACILITY:  [facility]   PHYSICIAN:  Langley Gauss, M.D.                DATE OF BIRTH:  September 24, 1955   DATE OF ADMISSION:  03/27/2002  DATE OF DISCHARGE:  03/27/2002                                HISTORY & PHYSICAL   HISTORY OF PRESENT ILLNESS:  This is a 55 year old gravida 2, para 2 who  presents to the emergency room with the onset of vaginal bleeding which has  been continuous since February 17, 2002.  I was contacted immediately upon  the patient's presentation to the emergency room for complete evaluation and  management.  The patient's history is that she has just completed a Premarin  and Prometrium cycle in an effort to control bleeding and resulted in  withdrawal bleed; however, she did not completely stop bleeding and on  March 24, 2002 she had the onset of very heavy vaginal bleeding.  It  recurred again March 26, 2002 and again today, such that she states she  is passing golfball-sized clots and feels dizzy.  The patient's previous  workup is significant for a previous episode of abnormal uterine bleeding.  The patient underwent D&C at that time, which revealed dyssynchronous  endometrium.  The patient failed to follow up for serial Provera withdrawals  and we did not see her again until this current episode of vaginal bleeding.  The patient was at that time noted to have significant anemia with anemia of  7.4.   ALLERGIES:  She has no known drug allergies.   PAST MEDICAL HISTORY:  She has vaginal delivery x2 with tubal ligation in  1984, cholecystectomy in 1986, appendectomy in 1990.   PHYSICAL EXAMINATION:  GENERAL:  No acute distress.  VITAL SIGNS:  Height 5 feet 6 inches, weight 201 pounds, blood pressure  145/82, pulse of 95, respiratory rate is 24.  HEENT:  Negative.  No  adenopathy.  NECK:  Supple.  Thyroid is nonpalpable.  LUNGS:  Clear.  CARDIOVASCULAR:  Regular rate and rhythm.  ABDOMEN:  Soft and nontender.  No surgical scars are identified.  She is  noted to be moderately obese with a panniculus present.  PELVIC:  Exam reveals presence of blood on the inner thighs.  Cervix is  noted to be closed.  There is active bleeding noted with a small clot within  the posterior vaginal fornix.  Bimanual examination:  The uterus is noted to  be multiparous size and was previously noted to sound to a depth of 10 cm.  The ovaries are nonpalpable secondary to the patient's obesity.   LABORATORY DATA:  Pertinent laboratory studies:  Hemoglobin 9.5, hematocrit  28.8, with a white count of 8.0.   ASSESSMENT:  Morbidly obese patient with previous finding  of anovulatory  cycles.  The patient failed to follow up until this current active bleeding  episode.  She has at this point in time failed outpatient management with  estrogen and progesterone in an effort to control bleeding resulted in a  withdrawal bleed.  At this point in time, she  is likewise experiencing symptomatic anemia, thus I plan to take the patient  to the operating room for a dilatation and curettage.  The patient  understands and accepts that this is for urgent and immediate relief of  bleeding only.  She will require continued care to prevent this current  episode from recurring.                                               Langley Gauss, M.D.    DC/MEDQ  D:  03/31/2002  T:  03/31/2002  Job:  [id number]   cc:   [facility]

## 2010-09-01 NOTE — Op Note (Signed)
   NAME:  Ashley Serrano, Ashley Serrano                         ACCOUNT NO.:  [id number]   MEDICAL RECORD NO.:  [id number]                   PATIENT TYPE:  AMB   LOCATION:  DAY                                  FACILITY:  [facility]   PHYSICIAN:  Barbaraann Barthel, M.D.              DATE OF BIRTH:  08/14/1955   DATE OF PROCEDURE:  05/21/2002  DATE OF DISCHARGE:                                 OPERATIVE REPORT   HISTORY:  Surgery was asked to see this patient for a second procedure by  Dr. Lisette Grinder. He had planned a TAH/BSO on this patient and asked me while he  was there to repair an umbilical hernia. I had seen this patient  preoperatively. The plan was that he would do his TAH/BSO and then I would  fix the hernia. When he had finished his TAH/BSO, the procedure was  prolonged because of trying to find a lost sponge and I then entered to  expedite matters and go ahead and continue by fixing the umbilical hernia.   GROSS FINDINGS:  Umbilical hernia small, defect about the size of a dime.   SPECIMENS:  Umbilical hernia.   PROCEDURE:  Umbilical herniorrhaphy without the use of mesh.   TECHNIQUE:  A periumbilical incision was carried out over the inferior  aspect of the umbilicus, skin and subcutaneous tissue was incised, hernia  sac was dissected free from the skin of the umbilicus and this was sent as a  specimen. The defect was then closed with figure-of-eight #0 Prolene  sutures. The wound was then irrigated. There was minimal oozing. This was  controlled with the cautery device and then the skin of the umbilicus was  then tacked to the fascia with 3-0 Polysorb in order to return the umbilicus  to its normal concave appearance. The skin was then closed subcuticular with  a 4-0 Polysorb suture with Steri-Strips and Neosporin and an OpSite dressing  for this wound. The procedure took approximately 20 minutes. There were no  complications and for my portion of the procedure there were no lost  instruments  or needles. For details of the rest of the procedure, please  consult Dr. Preston Fleeting note who is responsible for the TAH/BSO.                                               Barbaraann Barthel, M.D.    WB/MEDQ  D:  05/21/2002  T:  05/21/2002  Job:  [id number]   cc:   Langley Gauss, M.D.  [address], [address]  [place]  [place] [address]  Fax: [phone]

## 2010-09-01 NOTE — Op Note (Signed)
   NAME:  Ashley Serrano, Ashley Serrano                         ACCOUNT NO.:  [id number]   MEDICAL RECORD NO.:  [id number]                   PATIENT TYPE:  AMB   LOCATION:  DAY                                  FACILITY:  [facility]   PHYSICIAN:  Roylene Reason. Lisette Grinder, M.D.             DATE OF BIRTH:  July 03, 1955   DATE OF PROCEDURE:  DATE OF DISCHARGE:                                 OPERATIVE REPORT   DISCHARGE MEDICATIONS:  1. Hemocyte F 1 p.o. every day.   LABORATORY DATA:  Hemoglobin 8.3.  Electrolytes within normal limits.  Liver  function tests minimally elevated with an SGOT of 44.   HOSPITAL COURSE:  The patient had been seen in the office one day  previously.  On 12/30/2001 she was taken to the operating room where the  dilatation and curettage was performed without complications.  The patient  continued to have a moderate vaginal bleeding up to the time of this  procedure.  Following the procedure, however, she had minimal vaginal  bleeding and remained afebrile.  Thus, the patient will be discharged to  home on the date of service 12/30/01.                                               Donald P. Lisette Grinder, M.D.    DPC/MEDQ  D:  12/30/2001  T:  12/31/2001  Job:  [id number]   cc:   [facility]

## 2010-09-01 NOTE — Op Note (Signed)
NAME:  [name], [name]                         ACCOUNT NO.:  [id number]   MEDICAL RECORD NO.:  [id number]                   PATIENT TYPE:  OBV   LOCATION:  A327                                 FACILITY:  [facility]   PHYSICIAN:  Dirk Dress. Katrinka Blazing, M.D.                DATE OF BIRTH:  1956-03-03   DATE OF PROCEDURE:  DATE OF DISCHARGE:                                 OPERATIVE REPORT   PREOPERATIVE DIAGNOSIS:  Ventral hernia with incarceration.   POSTOPERATIVE DIAGNOSIS:  Ventral hernia with incarceration.   PROCEDURE:  Mesh graft repair of ventral hernia.   SURGEON:  Dirk Dress. Katrinka Blazing, M.D.   DESCRIPTION:  Under general anesthesia the patient's abdomen was prepped and  draped in a sterile field.  An upper midline incision was made.  A large  ventral hernia was encountered with a large hernia sac with omentum and a  small portion of the transverse colon incarcerated in the large hernia sac.  The hernia sac was dissected from the surrounding tissue.  The sac was  opened and the adhesions of the sac to the omentum and the wall of the colon  were incised sharply.  The viscera and omentum were placed back into the  abdominal cavity.  A sponge was placed in the abdomen to protect the  underlying viscera.  The mesh was then sutured to the right side of the  fascia and muscle.  A piece of 6 x 6 inch mesh was chosen. It was cut in  half and folded.  It was used to envelope the muscle and fascia on the left  side.  It was sutured initially with a through-and-thorough running #0  Prolene at its edge which was about 3 cm from the midline.   Next, about six interrupted #0 Prolene were used to further attach the graft  to the muscle and fascia.  When this was done, the edge of the graft easily  abutted the edge of the hernia sac, the edge of the hernia opening on the  right side.  The fascial graft incision was then closed suturing the edge of  the mesh to the fascia with a running locking suture.  This was  done tension-  free.  At the lower end of the hernia, there was an edge of the hernia that  was not enveloped in mesh which measured about 2 cm.  Two sutures of  interrupted #1 Prolene were placed here and this was done without tension.  A JP drain was placed over the repair.  The subcutaneous tissue was closed  with running 1-0  Biosyn.  The skin was closed with staples.  The drain was secured with 3-0  Prolene.  The patient tolerated the procedure well.  Sterile dressing was  placed.  She was awakened from anesthesia uneventfully, transferred to a  bed, and taken to the postanesthetic care unit.  ___________________________________________                                            Dirk Dress Katrinka Blazing, M.D.   LCS/MEDQ  D:  11/05/2003  T:  11/05/2003  Job:  [id number]   cc:   Kirk Ruths, M.D.  [address]  [place]  [place] [address]  Fax: [phone]

## 2011-01-09 LAB — CBC
HCT: 39.4
Hemoglobin: 13.9
MCHC: 35.3
RDW: 15.7 — ABNORMAL HIGH

## 2011-01-09 LAB — BASIC METABOLIC PANEL
CO2: 28
Chloride: 100
Glucose, Bld: 110 — ABNORMAL HIGH
Potassium: 4.1
Sodium: 137

## 2011-01-26 LAB — POCT CARDIAC MARKERS
CKMB, poc: <1 — ABNORMAL LOW
Operator id: 198161
Troponin i, poc: <0.05

## 2011-01-26 LAB — COMPREHENSIVE METABOLIC PANEL
ALT: 28
AST: 22
Calcium: 9.2
GFR calc Af Amer: >60
Glucose, Bld: 98
Sodium: 135
Total Protein: 6.5

## 2011-01-26 LAB — DIFFERENTIAL
Eosinophils Absolute: 0.2
Lymphocytes Relative: 35
Lymphs Abs: 3.4 — ABNORMAL HIGH
Monocytes Relative: 9
Neutrophils Relative %: 52

## 2011-01-26 LAB — CBC
MCHC: 34.6
RDW: 15.5 — ABNORMAL HIGH

## 2011-01-29 ENCOUNTER — Other Ambulatory Visit (HOSPITAL_COMMUNITY): Payer: Self-pay | Admitting: Internal Medicine

## 2011-01-29 DIAGNOSIS — Z139 Encounter for screening, unspecified: Secondary | ICD-10-CM

## 2011-02-01 ENCOUNTER — Ambulatory Visit (HOSPITAL_COMMUNITY): Payer: BC Managed Care – PPO

## 2011-02-05 ENCOUNTER — Ambulatory Visit (HOSPITAL_COMMUNITY)
Admission: RE | Admit: 2011-02-05 | Discharge: 2011-02-05 | Disposition: A | Discharge status: Home / Self Care | Payer: BC Managed Care – PPO | Source: Ambulatory Visit | Attending: Internal Medicine | Admitting: Internal Medicine

## 2011-02-05 DIAGNOSIS — Z1231 Encounter for screening mammogram for malignant neoplasm of breast: Secondary | ICD-10-CM | POA: Insufficient documentation

## 2011-02-05 DIAGNOSIS — Z139 Encounter for screening, unspecified: Secondary | ICD-10-CM

## 2013-07-21 ENCOUNTER — Telehealth: Payer: Self-pay

## 2013-07-21 NOTE — Telephone Encounter (Signed)
Opened in error

## 2015-11-14 ENCOUNTER — Ambulatory Visit (HOSPITAL_COMMUNITY)
Admission: RE | Admit: 2015-11-14 | Discharge: 2015-11-14 | Disposition: A | Discharge status: Home / Self Care | Payer: BLUE CROSS/BLUE SHIELD | Source: Ambulatory Visit | Attending: Family Medicine | Admitting: Family Medicine

## 2015-11-14 ENCOUNTER — Other Ambulatory Visit (HOSPITAL_COMMUNITY): Payer: Self-pay | Admitting: Family Medicine

## 2015-11-14 DIAGNOSIS — M769 Unspecified enthesopathy, lower limb, excluding foot: Secondary | ICD-10-CM | POA: Insufficient documentation

## 2015-11-14 DIAGNOSIS — M25462 Effusion, left knee: Secondary | ICD-10-CM | POA: Diagnosis not present

## 2015-11-14 DIAGNOSIS — M25562 Pain in left knee: Secondary | ICD-10-CM

## 2015-11-29 ENCOUNTER — Encounter: Payer: Self-pay | Admitting: Orthopaedic Surgery

## 2015-11-29 ENCOUNTER — Ambulatory Visit (INDEPENDENT_AMBULATORY_CARE_PROVIDER_SITE_OTHER): Payer: BLUE CROSS/BLUE SHIELD | Admitting: Orthopaedic Surgery

## 2015-11-29 VITALS — BP 147/82 | HR 102 | Temp 97.8°F | Ht 65.5 in | Wt 216.0 lb

## 2015-11-29 DIAGNOSIS — M25562 Pain in left knee: Secondary | ICD-10-CM

## 2015-11-29 NOTE — Progress Notes (Signed)
Subjective: My left knee hurts    Patient ID: Ashley Serrano, female    DOB: 25-Jan-1956, 60 y.o.   MRN: [id number]  HPI  She has had pain of the left knee for many months.  Back in the fall and winter she was seen [facility].  She had three injections in her hip over that time for the knee pain.  She got better after each injection but the pain came back.  She has had swelling, popping of the knee. She has no giving way, no locking.  She has medial pain.  She has tried ice, heat and Advil with no help.  She saw Dr. Sudie BaileyKnowlton three weeks ago and had aspiration and injection of the knee.  He ordered x-rays done 11-14-15 showing: IMPRESSION: 1. Soft tissue swelling. 2. Calcific patellar tendinosis.  Her pain continues, is getting worse and she feels like the knee may give way but has not.  She is now not able to fully extend the knee and that makes it more painful and makes her limp more.  She is tired of it hurting.  Review of Systems  HENT: Negative for congestion.   Respiratory: Negative for cough and shortness of breath.   Cardiovascular: Negative for chest pain and leg swelling.  Endocrine: Positive for cold intolerance.  Musculoskeletal: Positive for arthralgias, gait problem and joint swelling.  Allergic/Immunologic: Positive for environmental allergies.   Past Medical History:  Diagnosis Date  . Multiple gastric ulcers     No past surgical history on file.  No current outpatient prescriptions on file prior to visit.   No current facility-administered medications on file prior to visit.     Social History   Social History  . Marital status: Divorced    Spouse name: N/A  . Number of children: N/A  . Years of education: N/A   Occupational History  . Not on file.   Social History Main Topics  . Smoking status: Current Every Day Smoker    Packs/day: 1.00    Types: Cigarettes  . Smokeless tobacco: Never Used  . Alcohol use Not on file   . Drug use: Unknown  . Sexual activity: Not on file   Other Topics Concern  . Not on file   Social History Narrative  . No narrative on file    Family History  Problem Relation Age of Onset  . Cancer Mother   . Thyroid disease Mother   . Hypertension Father   . Alcoholism Sister   . Seizures Sister   . Arthritis Sister   . Cancer Brother   . Arthritis Brother   . Ulcers Maternal Grandmother     BP (!) 147/82   Pulse (!) 102   Temp 97.8 F (36.6 C)   Ht 5' 5.5" (1.664 m)   Wt 216 lb (98 kg)   BMI 35.40 kg/m      Objective:   Physical Exam  Constitutional: She is oriented to person, place, and time. She appears well-developed and well-nourished.  HENT:  Head: Normocephalic and atraumatic.  Eyes: Conjunctivae and EOM are normal. Pupils are equal, round, and reactive to light.  Neck: Normal range of motion. Neck supple.  Cardiovascular: Normal rate, regular rhythm and intact distal pulses.   Pulmonary/Chest: Effort normal.  Abdominal: Soft.  Musculoskeletal: She exhibits tenderness (Left knee has 2+ effusion, painful medial joint line, ROM 5-100, positive medial McMurray, NV intact, limp to the left, right knee negative.).  Neurological: She is alert and oriented to person, place, and time. She displays normal reflexes. No cranial nerve deficit. She exhibits normal muscle tone. Coordination normal.  Skin: Skin is warm and dry.  Psychiatric: She has a normal mood and affect. Her behavior is normal. Judgment and thought content normal.     I have reviewed her x-rays done 11-14-15     Assessment & Plan:   Encounter Diagnosis  Name Primary?  . Left knee pain Yes   I have ordered a MRI of the left knee.  I am concerned she has a medial meniscus tear based on no help with time, rest, heat, medicine, lack of full extension by 5 degrees of the left knee and positive medial McMurray.  RTC after the MRI.  Call if any problem.  Precautions  discussed.  Electronically Signed Darreld McleanWayne Kwaku Mostafa, MD 8/15/20171:59 PM

## 2015-12-14 ENCOUNTER — Ambulatory Visit (HOSPITAL_COMMUNITY)
Admission: RE | Admit: 2015-12-14 | Discharge: 2015-12-14 | Disposition: A | Discharge status: Home / Self Care | Payer: BLUE CROSS/BLUE SHIELD | Source: Ambulatory Visit | Attending: Orthopaedic Surgery | Admitting: Orthopaedic Surgery

## 2015-12-14 DIAGNOSIS — X58XXXA Exposure to other specified factors, initial encounter: Secondary | ICD-10-CM | POA: Insufficient documentation

## 2015-12-14 DIAGNOSIS — M25562 Pain in left knee: Secondary | ICD-10-CM | POA: Diagnosis not present

## 2015-12-14 DIAGNOSIS — S83242A Other tear of medial meniscus, current injury, left knee, initial encounter: Secondary | ICD-10-CM | POA: Insufficient documentation

## 2015-12-14 DIAGNOSIS — M25462 Effusion, left knee: Secondary | ICD-10-CM | POA: Diagnosis not present

## 2015-12-14 DIAGNOSIS — M659 Synovitis and tenosynovitis, unspecified: Secondary | ICD-10-CM | POA: Insufficient documentation

## 2015-12-14 DIAGNOSIS — M67462 Ganglion, left knee: Secondary | ICD-10-CM | POA: Diagnosis not present

## 2015-12-20 ENCOUNTER — Telehealth: Payer: Self-pay | Admitting: Orthopedic Surgery

## 2015-12-20 ENCOUNTER — Ambulatory Visit (INDEPENDENT_AMBULATORY_CARE_PROVIDER_SITE_OTHER): Payer: BLUE CROSS/BLUE SHIELD | Admitting: Orthopaedic Surgery

## 2015-12-20 ENCOUNTER — Encounter: Payer: Self-pay | Admitting: Orthopaedic Surgery

## 2015-12-20 VITALS — BP 155/76 | HR 112 | Temp 97.7°F | Ht 65.5 in | Wt 216.0 lb

## 2015-12-20 DIAGNOSIS — M25562 Pain in left knee: Secondary | ICD-10-CM | POA: Diagnosis not present

## 2015-12-20 NOTE — Progress Notes (Signed)
Patient Ashley Serrano, female DOB:December 25, 1955, 60 y.o. FAO:[id number]  Chief Complaint  Patient presents with  . Follow-up    mri review left knee    HPI  Ashley Serrano is a 60 y.o. female who has had left knee pain and had a MRI done which shows: IMPRESSION: 1. Partial thickness radial tear involving the posterior horn of the medial meniscus near the meniscal root. There is partial detachment and associated mild medial protrusion of the meniscus. 2. Intrasubstance degenerative changes and probable small free edge tears involving the mid body region of the lateral meniscus. 3. Intact ligamentous structures. There is advanced mucoid degeneration of the ACL with a large intra and extra substance ganglion cysts. 4. Medial greater than lateral compartment degenerative changes. 5. Moderate-sized joint effusion and mild synovitis.  I have explained the findings to her and recommended she see Dr. Romeo AppleHarrison for consideration of arthroscopy of the left knee.  She is agreeable to this.  I did not discuss any further today about her smoking. HPI  Body mass index is 35.4 kg/m.  ROS  Review of Systems  HENT: Negative for congestion.   Respiratory: Negative for cough and shortness of breath.   Cardiovascular: Negative for chest pain and leg swelling.  Endocrine: Positive for cold intolerance.  Musculoskeletal: Positive for arthralgias, gait problem and joint swelling.  Allergic/Immunologic: Positive for environmental allergies.    Past Medical History:  Diagnosis Date  . Multiple gastric ulcers     No past surgical history on file.  Family History  Problem Relation Age of Onset  . Cancer Mother   . Thyroid disease Mother   . Hypertension Father   . Alcoholism Sister   . Seizures Sister   . Arthritis Sister   . Cancer Brother   . Arthritis Brother   . Ulcers Maternal Grandmother     Social History Social History  Substance Use Topics  . Smoking status: Current  Every Day Smoker    Packs/day: 1.00    Types: Cigarettes  . Smokeless tobacco: Never Used  . Alcohol use Not on file    No Known Allergies  Current Outpatient Prescriptions  Medication Sig Dispense Refill  . ALPRAZolam (XANAX) 0.25 MG tablet Take 0.25 mg by mouth at bedtime as needed for anxiety.    Marland Kitchen. ibuprofen (ADVIL,MOTRIN) 200 MG tablet Take 200 mg by mouth 2 (two) times daily.     No current facility-administered medications for this visit.      Physical Exam  Blood pressure (!) 155/76, pulse (!) 112, temperature 97.7 F (36.5 C), height 5' 5.5" (1.664 m), weight 216 lb (98 kg).  Constitutional: overall normal hygiene, normal nutrition, well developed, normal grooming, normal body habitus. Assistive device:none  Musculoskeletal: gait and station Limp left, muscle tone and strength are normal, no tremors or atrophy is present.  .  Neurological: coordination overall normal.  Deep tendon reflex/nerve stretch intact.  Sensation normal.  Cranial nerves II-XII intact.   Skin:   normal overall no scars, lesions, ulcers or rashes. No psoriasis.  Psychiatric: Alert and oriented x 3.  Recent memory intact, remote memory unclear.  Normal mood and affect. Well groomed.  Good eye contact.  Cardiovascular: overall no swelling, no varicosities, no edema bilaterally, normal temperatures of the legs and arms, no clubbing, cyanosis and good capillary refill.  Lymphatic: palpation is normal.  The left lower extremity is examined:  Inspection:  Thigh:  Non-tender and no defects  Knee has swelling 1+ effusion.  Joint tenderness is present                        Patient is tender over the medial joint line  Lower Leg:  Has normal appearance and no tenderness or defects  Ankle:  Non-tender and no defects  Foot:  Non-tender and no defects Range of Motion:  Knee:  Range of motion is: 0-110                        Crepitus is  present  Ankle:  Range of motion is  normal. Strength and Tone:  The left lower extremity has normal strength and tone. Stability:  Knee:  The knee has positive medial McMurray.  Ankle:  The ankle is stable.    The patient has been educated about the nature of the problem(s) and counseled on treatment options.  The patient appeared to understand what I have discussed and is in agreement with it.  Encounter Diagnosis  Name Primary?  . Left knee pain Yes    PLAN Call if any problems.  Precautions discussed.  Continue current medications.   Return to clinic to see Dr. Romeo Apple for consideration of knee arthroscopy on the left knee.  Electronically Signed Darreld Mclean, MD 9/5/201710:36 AM

## 2015-12-20 NOTE — Patient Instructions (Signed)
Smoking Cessation, Tips for Success If you are ready to quit smoking, congratulations! You have chosen to help yourself be healthier. Cigarettes bring nicotine, tar, carbon monoxide, and other irritants into your body. Your lungs, heart, and blood vessels will be able to work better without these poisons. There are many different ways to quit smoking. Nicotine gum, nicotine patches, a nicotine inhaler, or nicotine nasal spray can help with physical craving. Hypnosis, support groups, and medicines help break the habit of smoking. WHAT THINGS CAN I DO TO MAKE QUITTING EASIER?  Here are some tips to help you quit for good:  Pick a date when you will quit smoking completely. Tell all of your friends and family about your plan to quit on that date.  Do not try to slowly cut down on the number of cigarettes you are smoking. Pick a quit date and quit smoking completely starting on that day.  Throw away all cigarettes.   Clean and remove all ashtrays from your home, work, and car.  On a card, write down your reasons for quitting. Carry the card with you and read it when you get the urge to smoke.  Cleanse your body of nicotine. Drink enough water and fluids to keep your urine clear or pale yellow. Do this after quitting to flush the nicotine from your body.  Learn to predict your moods. Do not let a bad situation be your excuse to have a cigarette. Some situations in your life might tempt you into wanting a cigarette.  Never have "just one" cigarette. It leads to wanting another and another. Remind yourself of your decision to quit.  Change habits associated with smoking. If you smoked while driving or when feeling stressed, try other activities to replace smoking. Stand up when drinking your coffee. Brush your teeth after eating. Sit in a different chair when you read the paper. Avoid alcohol while trying to quit, and try to drink fewer caffeinated beverages. Alcohol and caffeine may urge you to  smoke.  Avoid foods and drinks that can trigger a desire to smoke, such as sugary or spicy foods and alcohol.  Ask people who smoke not to smoke around you.  Have something planned to do right after eating or having a cup of coffee. For example, plan to take a walk or exercise.  Try a relaxation exercise to calm you down and decrease your stress. Remember, you may be tense and nervous for the first 2 weeks after you quit, but this will pass.  Find new activities to keep your hands busy. Play with a pen, coin, or rubber band. Doodle or draw things on paper.  Brush your teeth right after eating. This will help cut down on the craving for the taste of tobacco after meals. You can also try mouthwash.   Use oral substitutes in place of cigarettes. Try using lemon drops, carrots, cinnamon sticks, or chewing gum. Keep them handy so they are available when you have the urge to smoke.  When you have the urge to smoke, try deep breathing.  Designate your home as a nonsmoking area.  If you are a heavy smoker, ask your health care provider about a prescription for nicotine chewing gum. It can ease your withdrawal from nicotine.  Reward yourself. Set aside the cigarette money you save and buy yourself something nice.  Look for support from others. Join a support group or smoking cessation program. Ask someone at home or at work to help you with your plan   to quit smoking.  Always ask yourself, "Do I need this cigarette or is this just a reflex?" Tell yourself, "Today, I choose not to smoke," or "I do not want to smoke." You are reminding yourself of your decision to quit.  Do not replace cigarette smoking with electronic cigarettes (commonly called e-cigarettes). The safety of e-cigarettes is unknown, and some may contain harmful chemicals.  If you relapse, do not give up! Plan ahead and think about what you will do the next time you get the urge to smoke. HOW WILL I FEEL WHEN I QUIT SMOKING? You  may have symptoms of withdrawal because your body is used to nicotine (the addictive substance in cigarettes). You may crave cigarettes, be irritable, feel very hungry, cough often, get headaches, or have difficulty concentrating. The withdrawal symptoms are only temporary. They are strongest when you first quit but will go away within 10-14 days. When withdrawal symptoms occur, stay in control. Think about your reasons for quitting. Remind yourself that these are signs that your body is healing and getting used to being without cigarettes. Remember that withdrawal symptoms are easier to treat than the major diseases that smoking can cause.  Even after the withdrawal is over, expect periodic urges to smoke. However, these cravings are generally short lived and will go away whether you smoke or not. Do not smoke! WHAT RESOURCES ARE AVAILABLE TO HELP ME QUIT SMOKING? Your health care provider can direct you to community resources or hospitals for support, which may include:  Group support.  Education.  Hypnosis.  Therapy.   This information is not intended to replace advice given to you by your health care provider. Make sure you discuss any questions you have with your health care provider.   Document Released: 12/30/2003 Document Revised: 04/23/2014 Document Reviewed: 09/18/2012 Elsevier Interactive Patient Education 2016 [employer]vier Inc.  

## 2015-12-20 NOTE — Telephone Encounter (Signed)
Per office visit with Dr Hilda LiasKeeling today, 12/20/15, after visit summary indicates: "Return for to see Dr. Romeo AppleHarrison for possible knee surgery" - please advise of date and time for appointment with Dr Romeo AppleHarrison.[phone] (Mobile) *Preferred*

## 2015-12-21 NOTE — Telephone Encounter (Signed)
Done

## 2015-12-27 ENCOUNTER — Ambulatory Visit: Payer: BLUE CROSS/BLUE SHIELD | Admitting: Orthopedic Surgery

## 2016-07-19 ENCOUNTER — Ambulatory Visit (HOSPITAL_COMMUNITY): Payer: BLUE CROSS/BLUE SHIELD | Attending: Orthopedic Surgery | Admitting: Physical Therapy

## 2016-07-19 DIAGNOSIS — M25662 Stiffness of left knee, not elsewhere classified: Secondary | ICD-10-CM | POA: Diagnosis present

## 2016-07-19 DIAGNOSIS — G8929 Other chronic pain: Secondary | ICD-10-CM | POA: Diagnosis present

## 2016-07-19 DIAGNOSIS — M25562 Pain in left knee: Secondary | ICD-10-CM | POA: Diagnosis not present

## 2016-07-19 DIAGNOSIS — R2689 Other abnormalities of gait and mobility: Secondary | ICD-10-CM | POA: Insufficient documentation

## 2016-07-19 DIAGNOSIS — R600 Localized edema: Secondary | ICD-10-CM | POA: Diagnosis present

## 2016-07-19 NOTE — Patient Instructions (Addendum)
Functional Quadriceps: Chair Squat    Keeping feet flat on floor, shoulder width apart, squat as low as is comfortable. Use support as necessary. Repeat _10___ times per set. Do _1___ sets per session. Do __3__ sessions per day.  http://orth.exer.us/736   Copyright  VHI. All rights reserved.  Heel Raise: Bilateral (Standing)    Rise on balls of feet. Repeat ___10_ times per set. Do 1____ sets per session. Do __3__ sessions per day.  http://orth.exer.us/38   Copyright  VHI. All rights reserved.  Strengthening: Quadriceps Set    Tighten muscles on top of thighs by pushing knees down into surface. Hold _5___ seconds. Repeat _10___ times per set. Do 1____ sets per session. Do _2___ sessions per day.  http://orth.exer.us/602   Copyright  VHI. All rights reserved.  Stretching: Hamstring (Supine)    Supporting right thigh behind knee, slowly straighten knee until stretch is felt in back of thigh. Now bend it as far as you are able to. Hold __30__ seconds. Repeat 3____ times per set. Do _1___ sets per session. Do ___3_ sessions per day.  http://orth.exer.us/656   Copyright  VHI. All rights reserved.  Self-Mobilization: Heel Slide (Supine)    Slide left heel toward buttocks until a gentle stretch is felt. Hold __5__ seconds. Relax. Repeat __10__ times per set. Do _1___ sets per session. Do 2____ sessions per day.  http://orth.exer.us/710   Copyright  VHI. All rights reserved.  Self-Mobilization: Knee Flexion (Prone)    Bring left heel toward buttocks as close as possible. Hold ___3_ seconds. Relax. Repeat _10___ times per set. Do _1___ sets per session. Do __3__ sessions per day.  http://orth.exer.us/596   Copyright  VHI. All rights reserved.  Straight Leg Raise (Prone)    Abdomen and head supported, keep left knee locked and raise leg at hip. Avoid arching low back. Repeat _10___ times per set. Do __1__ sets per session. Do ____2 sessions per  day.  http://orth.exer.us/1112   Copyright  VHI. All rights reserved.

## 2016-07-19 NOTE — Therapy (Signed)
[facility] [address] [place], [place], [address] Phone: [phone]   Fax:  [phone]  Physical Therapy Evaluation  Patient Details  Name: Ashley Serrano MRN: [id number] Date of Birth: [birth year] Referring Provider: Ollen Gross  Encounter Date: 07/19/2016      PT End of Session - 07/19/16 1552    Visit Number 1   Number of Visits 8   Date for PT Re-Evaluation 08/18/16   Authorization Type BCBS   Authorization - Visit Number 1   Authorization - Number of Visits 8   PT Start Time 1515   PT Stop Time 1553   PT Time Calculation (min) 38 min   Activity Tolerance Patient tolerated treatment well   Behavior During Therapy [facility] for tasks assessed/performed      Past Medical History:  Diagnosis Date  . Multiple gastric ulcers     No past surgical history on file.  There were no vitals filed for this visit.       Subjective Assessment - 07/19/16 1516    Subjective Ms. Douglass states that she has had left knee pain for quite a while therefore she opted to have arthroscopic surgery on 05-15-2016.  She is still having trouble walking therefore she has been referred to therapy.    Pertinent History unremarkable.    How long can you sit comfortably? going sit to stand is painful but not sitting itself.     How long can you stand comfortably? 30 minutes    How long can you walk comfortably? after 2 hours she wants to put her leg up.   Patient Stated Goals improved walking, pain to be down, be able to go up and down steps easier,    Currently in Pain? Yes   Pain Score 2   highest 10/10;   Pain Location Knee   Pain Orientation Left;Posterior   Pain Descriptors / Indicators Aching   Pain Type Chronic pain   Pain Onset More than a month ago   Pain Frequency Constant   Aggravating Factors  activity    Pain Relieving Factors ibuprofen,ice    Effect of Pain on Daily Activities increases             [facility] PT Assessment -  07/19/16 0001      Assessment   Medical Diagnosis Lt arhroscopic surgery   Referring Provider Homero Fellers Aluisio   Onset Date/Surgical Date 05/15/16   Next MD Visit 08/31/2016   Prior Therapy none     Precautions   Precautions None     Restrictions   Weight Bearing Restrictions No     Balance Screen   Has the patient fallen in the past 6 months No   Has the patient had a decrease in activity level because of a fear of falling?  Yes   Is the patient reluctant to leave their home because of a fear of falling?  No     Home [employer] information [employer] residence     Prior Function   Level of Independence Independent   Vocation Full time employment   Vocation Requirements standing, squatting, lifting    Leisure walking     Cognition   Overall Cognitive Status Within Functional Limits for tasks assessed     Observation/Other Assessments   Focus on Therapeutic Outcomes (FOTO)  41     Observation/Other Assessments-Edema    Edema --  mid jt:  LT 46.5 ; RT 44.0  Functional Tests   Functional tests Single leg stance     Single Leg Stance   Comments RT:  5" Lt 3"     ROM / Strength   AROM / PROM / Strength AROM;Strength     AROM   AROM Assessment Site Knee   Right/Left Knee Left   Left Knee Extension 2   Left Knee Flexion 75     Strength   Strength Assessment Site Hip;Knee;Ankle   Right/Left Hip Right;Left   Right Hip Flexion 5/5   Right Hip Extension 4+/5   Right Hip ABduction 5/5   Left Hip Flexion 5/5   Left Hip Extension 4/5   Left Hip ABduction 4+/5   Right/Left Knee Right;Left   Right Knee Flexion 5/5   Right Knee Extension 5/5   Left Knee Flexion 3+/5   Left Knee Extension 5/5   Right/Left Ankle Right;Left     Palpation   Patella mobility decreased      Ambulation/Gait   Gait Pattern Decreased hip/knee flexion - left;Decreased dorsiflexion - left;Left hip hike;Antalgic;Lateral trunk lean to right     Functional Gait  Assessment   Gait  assessed  --                   [facility] Adult PT Treatment/Exercise - 07/19/16 0001      Exercises   Exercises Knee/Hip     Knee/Hip Exercises: Stretches   Active Hamstring Stretch Right;2 reps;30 seconds     Knee/Hip Exercises: Standing   Heel Raises Both;10 reps   Functional Squat 10 reps     Knee/Hip Exercises: Supine   Quad Sets Left;10 reps   Straight Leg Raises Left;5 reps                PT Education - 07/19/16 1552    Education provided Yes   Education Details HEP   Person(s) Educated Patient   Methods Explanation;Handout;Verbal cues;Demonstration   Comprehension Verbalized understanding;Returned demonstration          PT Short Term Goals - 07/19/16 1558      PT SHORT TERM GOAL #1   Title Pt pain to be no greater than a 5/10 to allow pt to stand for 15 minutes to socialize without left knee pain.    Time 2   Period Weeks   Status New     PT SHORT TERM GOAL #2   Title Pt to be able to single leg stance for 10 seconds bilaterally to reduce risk of falling.    Time 2   Period Weeks   Status New     PT SHORT TERM GOAL #3   Title Pt Lt knee flexion to be to 100 to be able descend steps with increased ease.    Time 2   Period Weeks   Status New           PT Long Term Goals - 07/19/16 1600      PT LONG TERM GOAL #1   Title Pt to have a normalized gait to reduce stress on her hips and back    Time 4   Period Weeks   Status New     PT LONG TERM GOAL #2   Title PT Lt knee ROM to be to 115 to allow pt to pick items off the ground with ease    Time 4   Period Weeks   Status New     PT LONG TERM GOAL #3   Title PT  to be able to be on her feet for up to 3.5 hours for work duties.    Time 4   Period Weeks   Status New     PT LONG TERM GOAL #4   Title Pt to be able to single leg stance on foam for up to 10 seconds to allow pt to feel comfortable walking on the beach.    Time 4   Period Weeks   Status New                Plan - 07/19/16 1553    Clinical Impression Statement Ms. Harding is a 61 yo female who had arthroscopic surgery on her left knee on 05/15/2016.  She is still having pain and difficulty walking therefore she has been referred to skilled physcial therapy.  Examination demonstrates decreased ROM, decreased strength, decreased flexibility, decreased balance, increased edema, increased pain and abnormal gait.  Ms. Caison will benefit from skilled physical therapy to address these issues and maximize her functinal ability.     Rehab Potential Good   PT Frequency 2x / week   PT Duration 4 weeks   PT Treatment/Interventions ADLs/Self Care Home Management;Stair training;Gait training;Therapeutic exercise;Therapeutic activities;Balance training;Patient/family education;Manual techniques   PT Next Visit Plan begin heel toe gait training, SLS, quadricep stretch and gentle PROM to improve knee flexion as well as manal techniques to decrease edema.  Goal is to improve gait, flexion and decrease edema .   Consulted and Agree with Plan of Care Patient      Patient will benefit from skilled therapeutic intervention in order to improve the following deficits and impairments:  Abnormal gait, Decreased activity tolerance, Decreased balance, Decreased range of motion, Decreased strength, Difficulty walking, Increased edema, Pain  Visit Diagnosis: Chronic pain of left knee  Stiffness of left knee, not elsewhere classified  Localized edema  Other abnormalities of gait and mobility     Problem List There are no active problems to display for this patient.   Virgina Organ, PT CLT [phone] 07/19/2016, 4:05 PM  [facility] [address] [place], [place], [address] Phone: [phone]   Fax:  [phone]  Name: KEATON STIREWALT MRN: [id number] Date of Birth: 11-06-1955

## 2016-07-27 ENCOUNTER — Ambulatory Visit (HOSPITAL_COMMUNITY): Payer: BLUE CROSS/BLUE SHIELD

## 2016-07-27 DIAGNOSIS — R2689 Other abnormalities of gait and mobility: Secondary | ICD-10-CM

## 2016-07-27 DIAGNOSIS — M25562 Pain in left knee: Secondary | ICD-10-CM | POA: Diagnosis not present

## 2016-07-27 DIAGNOSIS — G8929 Other chronic pain: Secondary | ICD-10-CM

## 2016-07-27 DIAGNOSIS — M25662 Stiffness of left knee, not elsewhere classified: Secondary | ICD-10-CM

## 2016-07-27 DIAGNOSIS — R600 Localized edema: Secondary | ICD-10-CM

## 2016-07-27 NOTE — Therapy (Signed)
[facility] [address] [place], [place], [address] Phone: [phone]   Fax:  [phone]  Physical Therapy Treatment  Patient Details  Name: Ashley Serrano MRN: [id number] Date of Birth: Feb 25, 1956 Referring Provider: Ollen Gross  Encounter Date: 07/27/2016      PT End of Session - 07/27/16 1523    Visit Number 2   Number of Visits 8   Date for PT Re-Evaluation 08/18/16   Authorization Type BCBS   Authorization - Visit Number 2   Authorization - Number of Visits 8   PT Start Time 1518   PT Stop Time 1604   PT Time Calculation (min) 46 min   Activity Tolerance Patient tolerated treatment well;No increased pain   Behavior During Therapy WFL for tasks assessed/performed      Past Medical History:  Diagnosis Date  . Multiple gastric ulcers     No past surgical history on file.  There were no vitals filed for this visit.      Subjective Assessment - 07/27/16 1517    Subjective Pt stated pain minimal today pain scale 2/10, increases with standing and walking for prolonged length of time.  Reports compliance with HEP without questions.     Pertinent History unremarkable.    Patient Stated Goals improved walking, pain to be down, be able to go up and down steps easier,    Currently in Pain? Yes   Pain Score 2    Pain Location Knee   Pain Orientation Left   Pain Descriptors / Indicators Spasm;Tightness   Pain Type Chronic pain   Pain Onset More than a month ago   Pain Frequency Constant   Aggravating Factors  activity   Pain Relieving Factors ibuprofen, ice   Effect of Pain on Daily Activities increases               [facility] Adult PT Treatment/Exercise - 07/27/16 0001      Ambulation/Gait   Ambulation Distance (Feet) 552 Feet   Assistive device None   Gait Pattern Decreased hip/knee flexion - left;Decreased dorsiflexion - left;Left hip hike;Antalgic;Lateral trunk lean to right   Gait Comments Cueing to  equalize stance phase and heel toe pattern     Knee/Hip Exercises: Stretches   Lobbyist 3 reps;30 seconds   Quad Stretch Limitations prone with rope     Knee/Hip Exercises: Standing   Heel Raises Both;10 reps   Heel Raises Limitations toe raises incline slope   Functional Squat 10 reps   Functional Squat Limitations cueing for appropriate form/mechanics   Rocker Board 2 minutes   Rocker Board Limitations R/L     Knee/Hip Exercises: Seated   Sit to [employer] 10 reps;without UE support  Visual cueing for equal weight bearing with STS     Knee/Hip Exercises: Supine   Quad Sets Left;10 reps   Heel Slides 10 reps   Heel Slides Limitations 85 degrees following PROM     Knee/Hip Exercises: Prone   Hamstring Curl 10 reps   Hamstring Curl Limitations PROM 5 reps   Contract/Relax to Increase Flexion 5x 10" holds     Manual Therapy   Manual Therapy Edema management   Manual therapy comments Manual technqiues complete separate than rest of tx   Edema Management Retro massage for edema control with LE elevated prior ROM based therex                PT Education - 07/27/16 1548  Education provided Yes   Education Details Reviewed goals, assured compliance with HEP, copy of eval given to pt.  Reviewed benefits of ice application for edema and pain,.   Person(s) Educated Patient   Methods Explanation;Demonstration;Verbal cues;Handout;Tactile cues   Comprehension Verbalized understanding;Returned demonstration;Need further instruction          PT Short Term Goals - 07/19/16 1558      PT SHORT TERM GOAL #1   Title Pt pain to be no greater than a 5/10 to allow pt to stand for 15 minutes to socialize without left knee pain.    Time 2   Period Weeks   Status New     PT SHORT TERM GOAL #2   Title Pt to be able to single leg stance for 10 seconds bilaterally to reduce risk of falling.    Time 2   Period Weeks   Status New     PT SHORT TERM GOAL #3   Title Pt Lt knee  flexion to be to 100 to be able descend steps with increased ease.    Time 2   Period Weeks   Status New           PT Long Term Goals - 07/19/16 1600      PT LONG TERM GOAL #1   Title Pt to have a normalized gait to reduce stress on her hips and back    Time 4   Period Weeks   Status New     PT LONG TERM GOAL #2   Title PT Lt knee ROM to be to 115 to allow pt to pick items off the ground with ease    Time 4   Period Weeks   Status New     PT LONG TERM GOAL #3   Title PT to be able to be on her feet for up to 3.5 hours for work duties.    Time 4   Period Weeks   Status New     PT LONG TERM GOAL #4   Title Pt to be able to single leg stance on foam for up to 10 seconds to allow pt to feel comfortable walking on the beach.    Time 4   Period Weeks   Status New               Plan - 07/27/16 1805    Clinical Impression Statement Reviewed goals, assured compliace with HEP and copy of eval given to pt.  Pt arrived following work with noted increased swelling proximal knee.  Began session with manual retromassage for edema control with ROM based exercises to improve knee flexion.  PROM complete in prone per pt. tolerance with good results noted with contract/relax technqiue though needs extra cueing to relax quadriceps.  Improved knee flexion to 85 degrees following manual, functional stretches and ROM based therex.  Gait training to improve heel to toe mechanics and equalize stance phase with gait.  Visual cueing assistance to improve awareness of weight bearing with gait and sit to stands.  Added rocker board to improve weight distribution with vast improvements noted with gait and reports of pain reduced.     Rehab Potential Good   PT Frequency 2x / week   PT Duration 4 weeks   PT Treatment/Interventions ADLs/Self Care Home Management;Stair training;Gait training;Therapeutic exercise;Therapeutic activities;Balance training;Patient/family education;Manual techniques   PT  Next Visit Plan Add SLS next session.  Continue heel toe gait training, quadricep stretch and gentle PROM to  improve knee flexion as well as manal techniques to decrease edema.  Goal is to improve gait, flexion and decrease edema .      Patient will benefit from skilled therapeutic intervention in order to improve the following deficits and impairments:  Abnormal gait, Decreased activity tolerance, Decreased balance, Decreased range of motion, Decreased strength, Difficulty walking, Increased edema, Pain  Visit Diagnosis: Chronic pain of left knee  Stiffness of left knee, not elsewhere classified  Localized edema  Other abnormalities of gait and mobility     Problem List There are no active problems to display for this patient.  [address], LPTA; CBIS [phone]  Juel Burrow 07/27/2016, 6:13 PM  [facility] [address] [place], [place], [address] Phone: [phone]   Fax:  [phone]  Name: Ashley Serrano MRN: [id number] Date of Birth: 1956/03/18

## 2016-07-30 ENCOUNTER — Ambulatory Visit (HOSPITAL_COMMUNITY): Payer: BLUE CROSS/BLUE SHIELD | Admitting: Physical Therapy

## 2016-07-30 DIAGNOSIS — M25662 Stiffness of left knee, not elsewhere classified: Secondary | ICD-10-CM

## 2016-07-30 DIAGNOSIS — R600 Localized edema: Secondary | ICD-10-CM

## 2016-07-30 DIAGNOSIS — R2689 Other abnormalities of gait and mobility: Secondary | ICD-10-CM

## 2016-07-30 DIAGNOSIS — M25562 Pain in left knee: Principal | ICD-10-CM

## 2016-07-30 DIAGNOSIS — G8929 Other chronic pain: Secondary | ICD-10-CM

## 2016-07-30 NOTE — Therapy (Addendum)
[facility] [address] [place], [place], [address] Phone: [phone]   Fax:  [phone]  Physical Therapy Treatment  Patient Details  Name: Ashley Serrano MRN: [id number] Date of Birth: 01/19/1956 Referring Provider: Ollen Gross  Encounter Date: 07/30/2016      PT End of Session - 07/30/16 1631    Visit Number 3   Number of Visits 8   Date for PT Re-Evaluation 08/18/16   Authorization Type BCBS   Authorization - Visit Number 3   Authorization - Number of Visits 8   PT Start Time 1605   PT Stop Time 1645   PT Time Calculation (min) 40 min   Activity Tolerance Patient tolerated treatment well;No increased pain   Behavior During Therapy WFL for tasks assessed/performed      Past Medical History:  Diagnosis Date  . Multiple gastric ulcers     No past surgical history on file.  There were no vitals filed for this visit.      Subjective Assessment - 07/30/16 1744    Subjective Pt states she is doing well today, reports some discomfort and tightness but no real pain.     Currently in Pain? No/denies                         [facility] Adult PT Treatment/Exercise - 07/30/16 0001      Knee/Hip Exercises: Supine   Heel Slides 10 reps   Heel Slides Limitations 85 degrees following PROM   Straight Leg Raises Left;5 reps     Knee/Hip Exercises: Prone   Hamstring Curl 15 reps   Hip Extension 10 reps   Contract/Relax to Increase Flexion 5x 10" holds     Manual Therapy   Manual Therapy Edema management   Manual therapy comments completed first; Manual technqiues complete separate than rest of tx   Edema Management Retro massage for edema control with LE elevated prior ROM based therex                  PT Short Term Goals - 07/19/16 1558      PT SHORT TERM GOAL #1   Title Pt pain to be no greater than a 5/10 to allow pt to stand for 15 minutes to socialize without left knee pain.    Time 2   Period Weeks   Status New     PT SHORT TERM GOAL #2   Title Pt to be able to single leg stance for 10 seconds bilaterally to reduce risk of falling.    Time 2   Period Weeks   Status New     PT SHORT TERM GOAL #3   Title Pt Lt knee flexion to be to 100 to be able descend steps with increased ease.    Time 2   Period Weeks   Status New           PT Long Term Goals - 07/19/16 1600      PT LONG TERM GOAL #1   Title Pt to have a normalized gait to reduce stress on her hips and back    Time 4   Period Weeks   Status New     PT LONG TERM GOAL #2   Title PT Lt knee ROM to be to 115 to allow pt to pick items off the ground with ease    Time 4   Period Weeks   Status New  PT LONG TERM GOAL #3   Title PT to be able to be on her feet for up to 3.5 hours for work duties.    Time 4   Period Weeks   Status New     PT LONG TERM GOAL #4   Title Pt to be able to single leg stance on foam for up to 10 seconds to allow pt to feel comfortable walking on the beach.    Time 4   Period Weeks   Status New               Plan - 07/30/16 1716    Clinical Impression Statement Continued with primary focus on improving knee flexion and reducing swelling to Lt knee.  Pt has been complaint with HEP.  Noted antalgia with gait and persistant swelling since before her surgery.  Recommended patient try compression garments to assist with fluid contatinment and comfort.  Pt given information regarding Elastic Therapy in [place] and given her measurements.  Pt reported overall improvment at end of session.     Rehab Potential Good   PT Frequency 2x / week   PT Duration 4 weeks   PT Treatment/Interventions ADLs/Self Care Home Management;Stair training;Gait training;Therapeutic exercise;Therapeutic activities;Balance training;Patient/family education;Manual techniques   PT Next Visit Plan Add SLS next session.  Continue heel toe gait training, quadricep stretch and gentle PROM to improve  knee flexion as well as manal techniques to decrease edema.  Goal is to improve gait, flexion and decrease edema .      Patient will benefit from skilled therapeutic intervention in order to improve the following deficits and impairments:  Abnormal gait, Decreased activity tolerance, Decreased balance, Decreased range of motion, Decreased strength, Difficulty walking, Increased edema, Pain  Visit Diagnosis: Chronic pain of left knee  Stiffness of left knee, not elsewhere classified  Localized edema  Other abnormalities of gait and mobility     Problem List There are no active problems to display for this patient.   Lurena Nida, PTA/CLT [phone]  07/30/2016, 5:45 PM  [facility] [address] [place], [place], [address] Phone: [phone]   Fax:  [phone]  Name: Ashley Serrano MRN: [id number] Date of Birth: 11/27/1955

## 2016-08-03 ENCOUNTER — Ambulatory Visit (HOSPITAL_COMMUNITY): Payer: BLUE CROSS/BLUE SHIELD | Admitting: Physical Therapy

## 2016-08-03 DIAGNOSIS — G8929 Other chronic pain: Secondary | ICD-10-CM

## 2016-08-03 DIAGNOSIS — M25562 Pain in left knee: Secondary | ICD-10-CM | POA: Diagnosis not present

## 2016-08-03 DIAGNOSIS — M25662 Stiffness of left knee, not elsewhere classified: Secondary | ICD-10-CM

## 2016-08-03 DIAGNOSIS — R2689 Other abnormalities of gait and mobility: Secondary | ICD-10-CM

## 2016-08-03 DIAGNOSIS — R600 Localized edema: Secondary | ICD-10-CM

## 2016-08-03 NOTE — Therapy (Signed)
[facility] [address] [place], [place], [address] Phone: [phone]   Fax:  [phone]  Physical Therapy Treatment  Patient Details  Name: Ashley Serrano MRN: [id number] Date of Birth: 05/20/1955 Referring Provider: Ollen Gross  Encounter Date: 08/03/2016      PT End of Session - 08/03/16 1559    Visit Number 4   Number of Visits 8   Date for PT Re-Evaluation 08/18/16   Authorization Type BCBS   Authorization - Visit Number 4   Authorization - Number of Visits 8   PT Start Time 1550  Pt seen early due to availability in therapist's schedule    PT Stop Time 1630   PT Time Calculation (min) 40 min   Activity Tolerance Patient tolerated treatment well;No increased pain   Behavior During Therapy WFL for tasks assessed/performed      Past Medical History:  Diagnosis Date  . Multiple gastric ulcers     No past surgical history on file.  There were no vitals filed for this visit.      Subjective Assessment - 08/03/16 1550    Subjective Pt reports that her knee is doing well. She says she is babying it a little more than usual but for no real reason.    Currently in Pain? No/denies            [facility] PT Assessment - 08/03/16 0001      AROM   Left Knee Flexion 87  post session                      [facility] Adult PT Treatment/Exercise - 08/03/16 0001      Ambulation/Gait   Ambulation Distance (Feet) 225 Feet   Assistive device None   Gait Comments WBOS, decreased stance time on Lt, decreased PF  during push off and knee flexion during swing portion of gait cycle; therapist providing visual and verbal cues for correction      Knee/Hip Exercises: Standing   Other Standing Knee Exercises retro rocking in // bars x2 min with each LE forward      Knee/Hip Exercises: Seated   Other Seated Knee/Hip Exercises Lt knee flexion stretch 10x10sec hold    Sit to Sand 1 set;10 reps;with UE support  Pt unable to  correct weight shift due to lack of ROM      Knee/Hip Exercises: Supine   Heel Slides Left;1 set;15 reps     Manual Therapy   Manual Therapy Soft tissue mobilization;Joint mobilization;Passive ROM   Manual therapy comments separate rest of session    Joint Mobilization Grade III Lt proximal fibular mobs; Grade III posteromedial tibiofemoral jt mobs in sitting    Soft tissue mobilization Lt distal, lateral hamstring    Passive ROM Lt knee flexion stretch 10x10 sec                PT Education - 08/03/16 1620    Education provided Yes   Education Details implications for manual treatment and possible soreness following session; importance of toe push off during gait cycle and increased awareness of this at home.    Person(s) Educated Patient   Methods Explanation;Verbal cues   Comprehension Verbalized understanding;Need further instruction          PT Short Term Goals - 07/19/16 1558      PT SHORT TERM GOAL #1   Title Pt pain to be no greater than a 5/10 to allow pt  to stand for 15 minutes to socialize without left knee pain.    Time 2   Period Weeks   Status New     PT SHORT TERM GOAL #2   Title Pt to be able to single leg stance for 10 seconds bilaterally to reduce risk of falling.    Time 2   Period Weeks   Status New     PT SHORT TERM GOAL #3   Title Pt Lt knee flexion to be to 100 to be able descend steps with increased ease.    Time 2   Period Weeks   Status New           PT Long Term Goals - 07/19/16 1600      PT LONG TERM GOAL #1   Title Pt to have a normalized gait to reduce stress on her hips and back    Time 4   Period Weeks   Status New     PT LONG TERM GOAL #2   Title PT Lt knee ROM to be to 115 to allow pt to pick items off the ground with ease    Time 4   Period Weeks   Status New     PT LONG TERM GOAL #3   Title PT to be able to be on her feet for up to 3.5 hours for work duties.    Time 4   Period Weeks   Status New     PT  LONG TERM GOAL #4   Title Pt to be able to single leg stance on foam for up to 10 seconds to allow pt to feel comfortable walking on the beach.    Time 4   Period Weeks   Status New               Plan - 08/03/16 1629    Clinical Impression Statement Today's session focused on therex and manual techniques to improve pt's knee flexion ROM. Therapist performed manual techniques to address hamstring tightness as well as joint restrictions. Pt reporting improved pain with walking by the end of the session in addition to knee flexion PROM up to 87 deg. Ended session with gait training, pt needing increased cues to improve ankle PF during push off and she will continue to need reinforcement with this in future sessions.   Rehab Potential Good   PT Frequency 2x / week   PT Duration 4 weeks   PT Treatment/Interventions ADLs/Self Care Home Management;Stair training;Gait training;Therapeutic exercise;Therapeutic activities;Balance training;Patient/family education;Manual techniques   PT Next Visit Plan Continue gait training to increase push off, knee flexion ROM, STM along hamstrings/proximal fib mobility   Consulted and Agree with Plan of Care Patient      Patient will benefit from skilled therapeutic intervention in order to improve the following deficits and impairments:  Abnormal gait, Decreased activity tolerance, Decreased balance, Decreased range of motion, Decreased strength, Difficulty walking, Increased edema, Pain  Visit Diagnosis: Chronic pain of left knee  Stiffness of left knee, not elsewhere classified  Localized edema  Other abnormalities of gait and mobility     Problem List There are no active problems to display for this patient.   4:44 PM,08/03/16 Marylyn Ishihara PT, DPT [facility] Outpatient Physical Therapy [phone]   [facility] [address] [place], [place], [address] Phone: [phone]   Fax:   [phone]  Name: Ashley Serrano MRN: [id number] Date of Birth: 28-Dec-1955

## 2016-08-06 ENCOUNTER — Ambulatory Visit (HOSPITAL_COMMUNITY): Payer: BLUE CROSS/BLUE SHIELD | Admitting: Physical Therapy

## 2016-08-06 DIAGNOSIS — M25662 Stiffness of left knee, not elsewhere classified: Secondary | ICD-10-CM

## 2016-08-06 DIAGNOSIS — R2689 Other abnormalities of gait and mobility: Secondary | ICD-10-CM

## 2016-08-06 DIAGNOSIS — M25562 Pain in left knee: Secondary | ICD-10-CM | POA: Diagnosis not present

## 2016-08-06 DIAGNOSIS — R600 Localized edema: Secondary | ICD-10-CM

## 2016-08-06 DIAGNOSIS — G8929 Other chronic pain: Secondary | ICD-10-CM

## 2016-08-06 NOTE — Therapy (Signed)
[facility] [address] [place], [place], [address] Phone: [phone]   Fax:  [phone]  Physical Therapy Treatment  Patient Details  Name: Ashley Serrano MRN: [id number] Date of Birth: Sep 27, 1955 Referring Provider: Ollen Gross  Encounter Date: 08/06/2016      PT End of Session - 08/06/16 1601    Visit Number 5   Number of Visits 8   Date for PT Re-Evaluation 08/18/16   Authorization Type BCBS   Authorization - Visit Number 5   Authorization - Number of Visits 8   PT Start Time 1520   PT Stop Time 1600   PT Time Calculation (min) 40 min   Activity Tolerance Patient tolerated treatment well;No increased pain   Behavior During Therapy WFL for tasks assessed/performed      Past Medical History:  Diagnosis Date  . Multiple gastric ulcers     No past surgical history on file.  There were no vitals filed for this visit.      Subjective Assessment - 08/06/16 1526    Subjective Pt states that she is doing better.  She is doing her exercises at home    Pertinent History unremarkable.    Patient Stated Goals improved walking, pain to be down, be able to go up and down steps easier,    Currently in Pain? Yes   Pain Score 6    Pain Location Knee   Pain Orientation Left   Pain Descriptors / Indicators Aching   Pain Type Acute pain   Pain Onset More than a month ago   Aggravating Factors  terminal extension                          [facility] Adult PT Treatment/Exercise - 08/06/16 0001      Ambulation/Gait   Ambulation Distance (Feet) 225 Feet     Exercises   Exercises Knee/Hip     Knee/Hip Exercises: Stretches   Quad Stretch 3 reps;30 seconds   Quad Stretch Limitations with a sheet     Knee/Hip Exercises: Aerobic   Stationary Bike x 10 minutes at end of treatment no revolution      Knee/Hip Exercises: Supine   Quad Sets 10 reps   Heel Slides Left;10 reps   Heel Slides Limitations 90 degrees  following manual ; 95 AA     Knee/Hip Exercises: Prone   Hamstring Curl 10 reps   Hamstring Curl Limitations with RT LE assist    Contract/Relax to Increase Flexion 5x 10" holds   Other Prone Exercises terminal flexion x 10      Manual Therapy   Manual Therapy Soft tissue mobilization;Joint mobilization;Passive ROM   Manual therapy comments separate rest of session    Joint Mobilization Grade III Lt proximal fibular mobs; Grade III posteromedial tibiofemoral jt mobs in sitting    Passive ROM patellar mobs; Lt knee flexion stretch 10x10 sec                PT Education - 08/06/16 1600    Education provided Yes   Education Details heel toe gait pattern   Person(s) Educated Patient   Methods Explanation;Demonstration   Comprehension Verbalized understanding;Returned demonstration          PT Short Term Goals - 07/19/16 1558      PT SHORT TERM GOAL #1   Title Pt pain to be no greater than a 5/10 to allow pt to stand for  15 minutes to socialize without left knee pain.    Time 2   Period Weeks   Status New     PT SHORT TERM GOAL #2   Title Pt to be able to single leg stance for 10 seconds bilaterally to reduce risk of falling.    Time 2   Period Weeks   Status New     PT SHORT TERM GOAL #3   Title Pt Lt knee flexion to be to 100 to be able descend steps with increased ease.    Time 2   Period Weeks   Status New           PT Long Term Goals - 07/19/16 1600      PT LONG TERM GOAL #1   Title Pt to have a normalized gait to reduce stress on her hips and back    Time 4   Period Weeks   Status New     PT LONG TERM GOAL #2   Title PT Lt knee ROM to be to 115 to allow pt to pick items off the ground with ease    Time 4   Period Weeks   Status New     PT LONG TERM GOAL #3   Title PT to be able to be on her feet for up to 3.5 hours for work duties.    Time 4   Period Weeks   Status New     PT LONG TERM GOAL #4   Title Pt to be able to single leg stance  on foam for up to 10 seconds to allow pt to feel comfortable walking on the beach.    Time 4   Period Weeks   Status New               Plan - 08/06/16 1601    Clinical Impression Statement Pt treatment continues to focus on gait and knee flexion.  Pt continues to have increased edema.  Manual performed for both edema management and jt mobilization and PROM to promote increased flexion.    Rehab Potential Good   PT Frequency 2x / week   PT Duration 4 weeks   PT Treatment/Interventions ADLs/Self Care Home Management;Stair training;Gait training;Therapeutic exercise;Therapeutic activities;Balance training;Patient/family education;Manual techniques   PT Next Visit Plan focus on knee flexion    Consulted and Agree with Plan of Care Patient      Patient will benefit from skilled therapeutic intervention in order to improve the following deficits and impairments:  Abnormal gait, Decreased activity tolerance, Decreased balance, Decreased range of motion, Decreased strength, Difficulty walking, Increased edema, Pain  Visit Diagnosis: Chronic pain of left knee  Stiffness of left knee, not elsewhere classified  Localized edema  Other abnormalities of gait and mobility     Problem List There are no active problems to display for this patient.  Virgina Organ, PT CLT [phone] 08/06/2016, 4:03 PM  [facility] [address] [place], [place], [address] Phone: [phone]   Fax:  [phone]  Name: TIFFNY GEMMER MRN: [id number] Date of Birth: 19-Mar-1956

## 2016-08-10 ENCOUNTER — Ambulatory Visit (HOSPITAL_COMMUNITY): Payer: BLUE CROSS/BLUE SHIELD | Admitting: Physical Therapy

## 2016-08-10 DIAGNOSIS — G8929 Other chronic pain: Secondary | ICD-10-CM

## 2016-08-10 DIAGNOSIS — M25562 Pain in left knee: Secondary | ICD-10-CM | POA: Diagnosis not present

## 2016-08-10 DIAGNOSIS — R2689 Other abnormalities of gait and mobility: Secondary | ICD-10-CM

## 2016-08-10 DIAGNOSIS — R600 Localized edema: Secondary | ICD-10-CM

## 2016-08-10 DIAGNOSIS — M25662 Stiffness of left knee, not elsewhere classified: Secondary | ICD-10-CM

## 2016-08-10 NOTE — Therapy (Signed)
[facility] [address] [place], [place], [address] Phone: [phone]   Fax:  [phone]  Physical Therapy Treatment  Patient Details  Name: Ashley Serrano MRN: [id number] Date of Birth: May 29, 1955 Referring Provider: Ollen Gross  Encounter Date: 08/10/2016      PT End of Session - 08/10/16 1000    Visit Number 6   Number of Visits 8   Date for PT Re-Evaluation 08/18/16   Authorization Type BCBS   Authorization - Visit Number 6   Authorization - Number of Visits 8   PT Start Time 0904   PT Stop Time 0949   PT Time Calculation (min) 45 min   Activity Tolerance Patient tolerated treatment well;No increased pain   Behavior During Therapy WFL for tasks assessed/performed      Past Medical History:  Diagnosis Date  . Multiple gastric ulcers     No past surgical history on file.  There were no vitals filed for this visit.     Subjective:  Pt states that she has no pain at this time.  She is able to stand on one leg longer now.  She is  Completing her exercises at home but has continued difficulty with bending her knee.    Pain:  0/10       [facility] Adult PT Treatment/Exercise - 08/10/16 0001      Ambulation/Gait   Ambulation Distance (Feet) 225 Feet     Knee/Hip Exercises: Stretches   Quad Stretch 3 reps;30 seconds     Knee/Hip Exercises: Aerobic   Stationary Bike x 10 minutes at end of treatment no revolution      Knee/Hip Exercises: Seated   Stool Scoot - Round Trips 2     Knee/Hip Exercises: Supine   Quad Sets 20 reps   Heel Slides Left;15 reps   Heel Slides Limitations 98   Knee Flexion PROM     Knee/Hip Exercises: Prone   Hamstring Curl 10 reps   Hamstring Curl Limitations with RT LE assist    Contract/Relax to Increase Flexion 5x 10" holds   Other Prone Exercises terminal flexion x 10      Manual Therapy   Manual Therapy Soft tissue mobilization;Joint mobilization;Passive ROM   Manual therapy  comments separate rest of session    Joint Mobilization Grade III Lt proximal fibular mobs; Grade III posteromedial tibiofemoral jt mobs in sitting    Passive ROM patellar mobs; Lt knee flexion stretch 10x10 sec                PT Education - 08/10/16 0959    Education provided Yes   Education Details heel toe gait pattern and the importance of using the hallway and practicing for 5-10 minutes everyday.    Person(s) Educated Patient   Methods Explanation   Comprehension Verbalized understanding;Returned demonstration          PT Short Term Goals - 08/10/16 1002      PT SHORT TERM GOAL #1   Title Pt pain to be no greater than a 5/10 to allow pt to stand for 15 minutes to socialize without left knee pain.    Time 2   Period Weeks   Status Achieved     PT SHORT TERM GOAL #2   Title Pt to be able to single leg stance for 10 seconds bilaterally to reduce risk of falling.    Time 2   Period Weeks   Status Achieved  PT SHORT TERM GOAL #3   Title Pt Lt knee flexion to be to 100 to be able descend steps with increased ease.    Time 2   Period Weeks   Status On-going           PT Long Term Goals - 08/10/16 1002      PT LONG TERM GOAL #1   Title Pt to have a normalized gait to reduce stress on her hips and back    Time 4   Period Weeks   Status On-going     PT LONG TERM GOAL #2   Title PT Lt knee ROM to be to 115 to allow pt to pick items off the ground with ease    Time 4   Period Weeks   Status On-going     PT LONG TERM GOAL #3   Title PT to be able to be on her feet for up to 3.5 hours for work duties.    Time 4   Period Weeks   Status Achieved     PT LONG TERM GOAL #4   Title Pt to be able to single leg stance on foam for up to 10 seconds to allow pt to feel comfortable walking on the beach.    Time 4   Period Weeks   Status On-going               Plan - 08/10/16 1000    Clinical Impression Statement Added stool scoots and manual to  hamstrings and quadriceps today.  Pt has improved to 98 degrees flexion,(8 degrees more from last visit).  Pt session continues to focus on flexion and gait pattern.    Rehab Potential Good   PT Frequency 2x / week   PT Duration 4 weeks   PT Treatment/Interventions ADLs/Self Care Home Management;Stair training;Gait training;Therapeutic exercise;Therapeutic activities;Balance training;Patient/family education;Manual techniques   PT Next Visit Plan two more sessions.  Focus solely on improving flexion with a goal of 115 and improved gait mechanics.    Consulted and Agree with Plan of Care Patient      Patient will benefit from skilled therapeutic intervention in order to improve the following deficits and impairments:  Abnormal gait, Decreased activity tolerance, Decreased balance, Decreased range of motion, Decreased strength, Difficulty walking, Increased edema, Pain  Visit Diagnosis: Chronic pain of left knee  Stiffness of left knee, not elsewhere classified  Localized edema  Other abnormalities of gait and mobility     Problem List There are no active problems to display for this patient.   Virgina Organ, PT CLT [phone] 08/10/2016, 10:03 AM  [facility] [address] [place], [place], [address] Phone: [phone]   Fax:  [phone]  Name: Ashley Serrano MRN: [id number] Date of Birth: July 11, 1955

## 2016-08-10 NOTE — Patient Instructions (Addendum)
Knee Wall Slide    Slowly "walk" or slide feet on wall toward floor until stretch is felt in knees. Repeat __5__ times per set. Do __1__ sets per session. Do _1___ sessions per day. Hold at least 30" to 1 minute. http://orth.exer.us/674   Copyright  VHI. All rights reserved.

## 2016-08-13 ENCOUNTER — Encounter (HOSPITAL_COMMUNITY): Payer: Self-pay

## 2016-08-13 ENCOUNTER — Ambulatory Visit (HOSPITAL_COMMUNITY): Payer: BLUE CROSS/BLUE SHIELD

## 2016-08-13 DIAGNOSIS — M25662 Stiffness of left knee, not elsewhere classified: Secondary | ICD-10-CM

## 2016-08-13 DIAGNOSIS — R2689 Other abnormalities of gait and mobility: Secondary | ICD-10-CM

## 2016-08-13 DIAGNOSIS — R600 Localized edema: Secondary | ICD-10-CM

## 2016-08-13 DIAGNOSIS — G8929 Other chronic pain: Secondary | ICD-10-CM

## 2016-08-13 DIAGNOSIS — M25562 Pain in left knee: Secondary | ICD-10-CM | POA: Diagnosis not present

## 2016-08-13 NOTE — Therapy (Signed)
[facility] [address] [place], [place], [address] Phone: [phone]   Fax:  [phone]  Physical Therapy Treatment  Patient Details  Name: Ashley Serrano MRN: [id number] Date of Birth: 05/01/1955 Referring Provider: Ollen Gross  Encounter Date: 08/13/2016      PT End of Session - 08/13/16 0857    Visit Number 7   Number of Visits 8   Date for PT Re-Evaluation 08/18/16   Authorization Type BCBS   Authorization - Visit Number 7   Authorization - Number of Visits 8   PT Start Time [phone]   PT Stop Time 0900   PT Time Calculation (min) 36 min   Activity Tolerance Patient tolerated treatment well;No increased pain   Behavior During Therapy WFL for tasks assessed/performed      Past Medical History:  Diagnosis Date  . Multiple gastric ulcers     History reviewed. No pertinent surgical history.  There were no vitals filed for this visit.      Subjective Assessment - 08/13/16 0825    Subjective Pt states that her knee feels stiff this morning which she thinks is due to the cold air.   Pertinent History unremarkable.    Patient Stated Goals improved walking, pain to be down, be able to go up and down steps easier,    Currently in Pain? Yes   Pain Score 5    Pain Location Knee   Pain Orientation Left   Pain Descriptors / Indicators Tightness   Pain Type Acute pain   Pain Onset More than a month ago   Pain Frequency Constant   Aggravating Factors  terminal extension   Pain Relieving Factors ibuprofen, ice   Effect of Pain on Daily Activities increases              [facility] Adult PT Treatment/Exercise - 08/13/16 0001      Knee/Hip Exercises: Stretches   Lobbyist 3 reps;30 seconds   Quad Stretch Limitations prone with sheet     Knee/Hip Exercises: Standing   Knee Flexion Left;10 reps   Knee Flexion Limitations 10 second holds at end range; pt at 94 deg during exercise     Knee/Hip Exercises: Seated   Stool Scoot - Round Trips 15 ft x 1 RT     Knee/Hip Exercises: Supine   Heel Slides Left;10 reps     Knee/Hip Exercises: Prone   Contract/Relax to Increase Flexion 5x 10" holds     Manual Therapy   Manual Therapy Joint mobilization;Passive ROM   Manual therapy comments separate rest of session    Joint Mobilization Grade 3 posteromedial tibiofemoral joint mobs in supine 10 x 10 sec oscillations; Grade 3 posterior knee joint mobs to improve flexion 10 x 10-15 sec oscillations at end range   Soft tissue mobilization cross friction and efflurage to distal L lateral HS x 8 mins after contract relax                  PT Short Term Goals - 08/10/16 1002      PT SHORT TERM GOAL #1   Title Pt pain to be no greater than a 5/10 to allow pt to stand for 15 minutes to socialize without left knee pain.    Time 2   Period Weeks   Status Achieved     PT SHORT TERM GOAL #2   Title Pt to be able to single leg stance for 10 seconds bilaterally to reduce  risk of falling.    Time 2   Period Weeks   Status Achieved     PT SHORT TERM GOAL #3   Title Pt Lt knee flexion to be to 100 to be able descend steps with increased ease.    Time 2   Period Weeks   Status On-going           PT Long Term Goals - 08/10/16 1002      PT LONG TERM GOAL #1   Title Pt to have a normalized gait to reduce stress on her hips and back    Time 4   Period Weeks   Status On-going     PT LONG TERM GOAL #2   Title PT Lt knee ROM to be to 115 to allow pt to pick items off the ground with ease    Time 4   Period Weeks   Status On-going     PT LONG TERM GOAL #3   Title PT to be able to be on her feet for up to 3.5 hours for work duties.    Time 4   Period Weeks   Status Achieved     PT LONG TERM GOAL #4   Title Pt to be able to single leg stance on foam for up to 10 seconds to allow pt to feel comfortable walking on the beach.    Time 4   Period Weeks   Status On-going            Plan -  08/13/16 0902    Clinical Impression Statement Treatment session limited as PT late to start session due to morning huddle running late. Continued to focus on knee flexion. She c/o increased L distal lateral HS pain during contract relax so addressed this with STM which improved her symptoms. Pt's AROM 94 and AAROM 103 at EOS.   Rehab Potential Good   PT Frequency 2x / week   PT Duration 4 weeks   PT Treatment/Interventions ADLs/Self Care Home Management;Stair training;Gait training;Therapeutic exercise;Therapeutic activities;Balance training;Patient/family education;Manual techniques   PT Next Visit Plan two more sessions.  Focus solely on improving flexion with a goal of 115 and improved gait mechanics.    Consulted and Agree with Plan of Care Patient      Patient will benefit from skilled therapeutic intervention in order to improve the following deficits and impairments:  Abnormal gait, Decreased activity tolerance, Decreased balance, Decreased range of motion, Decreased strength, Difficulty walking, Increased edema, Pain  Visit Diagnosis: Chronic pain of left knee  Stiffness of left knee, not elsewhere classified  Localized edema  Other abnormalities of gait and mobility     Problem List There are no active problems to display for this patient.   Jac Canavan PT, DPT   [place] [facility] [address] [place], [place], [address] Phone: [phone]   Fax:  [phone]  Name: Ashley Serrano MRN: [id number] Date of Birth: October 22, 1955

## 2016-08-16 ENCOUNTER — Encounter (HOSPITAL_COMMUNITY): Payer: Self-pay

## 2016-08-16 ENCOUNTER — Ambulatory Visit (HOSPITAL_COMMUNITY): Payer: BLUE CROSS/BLUE SHIELD | Attending: Orthopedic Surgery

## 2016-08-16 DIAGNOSIS — M25562 Pain in left knee: Secondary | ICD-10-CM | POA: Insufficient documentation

## 2016-08-16 DIAGNOSIS — R2689 Other abnormalities of gait and mobility: Secondary | ICD-10-CM

## 2016-08-16 DIAGNOSIS — M25662 Stiffness of left knee, not elsewhere classified: Secondary | ICD-10-CM | POA: Diagnosis present

## 2016-08-16 DIAGNOSIS — R600 Localized edema: Secondary | ICD-10-CM | POA: Diagnosis present

## 2016-08-16 DIAGNOSIS — G8929 Other chronic pain: Secondary | ICD-10-CM | POA: Diagnosis present

## 2016-08-16 NOTE — Patient Instructions (Addendum)
  Knee Flexion Stretch on Step  Place foot on step and lean forward until you feel a good stretch in front of knee.   Perform 5-10 reps holding for 10-15 seconds each   Mini Squats  Stand facing a counter, with feet shoulder width apart. Use your hands for balance if you need to. Perform a mini squat and back up to straight.  Perform 2-3 sets of 10-15 reps each, holding for 3 seconds at the bottom   KNEE FLEXION STRETCH - SELF ASSISTED  While seated in a chair, use your unaffected leg to bend your affected knee until a stretch is felt. Use your R leg to help bend your L leg under the chair.  Perform 2-3 sets of 5 reps holding for 20-30 seconds each. Can put a pillow case under your foot to help it slide back better

## 2016-08-16 NOTE — Therapy (Signed)
[place], [place], [address] Phone: [phone]   Fax:  [phone]  Physical Therapy Treatment/Reassessment  Patient Details  Name: Ashley Serrano MRN: [id number] Date of Birth: 05/18/1955 Referring Provider: Gaynelle Arabian  Encounter Date: 08/16/2016      PT End of Session - 08/16/16 0820    Visit Number 8   Number of Visits 8   Date for PT Re-Evaluation 08/18/16   Authorization Type BCBS   Authorization - Visit Number 8   Authorization - Number of Visits 8   PT Start Time [phone]   PT Stop Time 0900   PT Time Calculation (min) 43 min   Activity Tolerance Patient tolerated treatment well;No increased pain   Behavior During Therapy WFL for tasks assessed/performed      Past Medical History:  Diagnosis Date  . Multiple gastric ulcers     History reviewed. No pertinent surgical history.  There were no vitals filed for this visit.      Subjective Assessment - 08/16/16 0819    Subjective Pt states that she feels a little stiff this morning but otherwise no pain.   Pertinent History unremarkable.    Patient Stated Goals improved walking, pain to be down, be able to go up and down steps easier,    Currently in Pain? Yes   Pain Score 4    Pain Location Knee   Pain Orientation Left   Pain Descriptors / Indicators Tightness   Pain Type Acute pain   Pain Onset More than a month ago   Pain Frequency Constant   Aggravating Factors  terminal extension   Pain Relieving Factors ibuprofen, ice   Effect of Pain on Daily Activities increases            [facility] PT Assessment - 08/16/16 0001      AROM   Left Knee Flexion 100  109 AAROM     Ambulation/Gait   Ambulation Distance (Feet) 300 Feet   Assistive device None   Gait Comments decreased knee flexion, decreased push-off, decreased DF on LLE and increased R lateral trunk lean throughout gait     Balance   Balance Assessed Yes     Static Standing  Balance   Static Standing - Balance Support No upper extremity supported   Static Standing Balance -  Activities  Single Leg Stance - Right Leg;Single Leg Stance - Left Leg   Static Standing - Comment/# of Minutes on foam: LLE 6 sec or <, RLE 3 sec or <           [facility] Adult PT Treatment/Exercise - 08/16/16 0001      Knee/Hip Exercises: Standing   Knee Flexion Left;10 reps   Knee Flexion Limitations 10-15 second holds each   Other Standing Knee Exercises step over hurdles (6" and 12") x 10 RT leading with LLE, x 10 RT leading with RLE   Other Standing Knee Exercises squats with hands on counter x 10 holding for 3 seconds     Knee/Hip Exercises: Seated   Other Seated Knee/Hip Exercises L knee flexion stretch 3 x 30 sec holds             PT Education - 08/16/16 0903    Education provided Yes   Education Details POC, proper gait pattern, increased knee flexion, proper exercise technique, perform split squat instead of full squat to improve function at work, update HEP   Person(s) Educated Patient   Methods  Explanation;Demonstration;Handout   Comprehension Verbalized understanding;Returned demonstration          PT Short Term Goals - 08/16/16 0830      PT SHORT TERM GOAL #1   Title Pt pain to be no greater than a 5/10 to allow pt to stand for 15 minutes to socialize without left knee pain.    Time 2   Period Weeks   Status Achieved     PT SHORT TERM GOAL #2   Title Pt to be able to single leg stance for 10 seconds bilaterally to reduce risk of falling.    Time 2   Period Weeks   Status Achieved     PT SHORT TERM GOAL #3   Title Pt Lt knee flexion to be to 100 to be able descend steps with increased ease.    Baseline 5/3: 100 AROM this date   Time 2   Period Weeks   Status Achieved           PT Long Term Goals - 08/16/16 6440      PT LONG TERM GOAL #1   Title Pt to have a normalized gait to reduce stress on her hips and back    Time 4   Period Weeks    Status On-going     PT LONG TERM GOAL #2   Title PT Lt knee ROM to be to 115 to allow pt to pick items off the ground with ease    Baseline 5/3: 100 AROM, 109 AAROM   Time 4   Period Weeks   Status On-going     PT LONG TERM GOAL #3   Title PT to be able to be on her feet for up to 3.5 hours for work duties.    Time 4   Period Weeks   Status Achieved     PT LONG TERM GOAL #4   Title Pt to be able to single leg stance on foam for up to 10 seconds to allow pt to feel comfortable walking on the beach.    Baseline 5/3: RLE: 3 seconds or <, LLE: 6 seconds or <   Time 4   Period Weeks   Status On-going               Plan - 08/16/16 3474    Clinical Impression Statement PT reassessed pt's outcome measures and goals this date. Pt has made steady progress towards her goals as she has met all of her STG and 1 LTG, but she is still limited in her AROM, gait, and dynamic balance. She was able to achieve 100 deg AROM knee flexion this date and 109 deg AAROM with PT assistance. 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 Pt's gait continues to be deficient and she was only able to maintain her balance on foam for less than 6 seconds BLE. Pt would benefit from continued skilled PT intervention to improve her knee ROM, gait, and balance in order to maximize her overall QoL and function at work. Pt verbalized some financial concerns but was agreeable to coming every other week for 8 more weeks (4 more sessions). PT educated pt on the importance of HEP compliance and she verbalized understanding. PT also educated pt on performing split squat at work to assist her in getting down to  the floor since she has difficulty with full squatting at this time. Pt demo'd understanding. At this time, reqesting brief extension of skilled PT intervention  to maximize ROM, gait, and balance to promote return to PLOF.   Rehab Potential Good   PT Frequency Biweekly   PT Duration 8 weeks   PT Treatment/Interventions ADLs/Self Care Home Management;Stair training;Gait training;Therapeutic exercise;Therapeutic activities;Balance training;Patient/family education;Manual techniques   PT Next Visit Plan Biweekly visits -- update HEP each session to promote knee flexion and ensure compliance with HEP. practice split squats down to floor with LLE forward (simulate work duties), continue stool scoots, knee drives, gait training, stepping over hurdles   PT Home Exercise Plan 5/3: pt stated she was performing prone quad stretch, heel slides, prone TKE, quad set, wall walking with feet, stationary bike, stairs; added knee drives on step, squats at kitchen counter, seated knee flexion   Consulted and Agree with Plan of Care Patient      Patient will benefit from skilled therapeutic intervention in order to improve the following deficits and impairments:  Abnormal gait, Decreased activity tolerance, Decreased balance, Decreased range of motion, Decreased strength, Difficulty walking, Increased edema, Pain  Visit Diagnosis: Chronic pain of left knee - Plan: PT plan of care cert/re-cert  Stiffness of left knee, not elsewhere classified - Plan: PT plan of care cert/re-cert  Localized edema - Plan: PT plan of care cert/re-cert  Other abnormalities of gait and mobility - Plan: PT plan of care cert/re-cert     Problem List There are no active problems to display for this patient.    Geraldine Solar PT, [place] [address] [place], [place], [address] Phone: [phone]   Fax:  [phone]  Name: Ashley Serrano MRN: [id number] Date of Birth: July 26, 1955

## 2016-08-30 ENCOUNTER — Encounter (HOSPITAL_COMMUNITY): Payer: Self-pay

## 2016-08-30 ENCOUNTER — Ambulatory Visit (HOSPITAL_COMMUNITY): Payer: BLUE CROSS/BLUE SHIELD

## 2016-08-30 DIAGNOSIS — R600 Localized edema: Secondary | ICD-10-CM

## 2016-08-30 DIAGNOSIS — M25662 Stiffness of left knee, not elsewhere classified: Secondary | ICD-10-CM

## 2016-08-30 DIAGNOSIS — M25562 Pain in left knee: Secondary | ICD-10-CM | POA: Diagnosis not present

## 2016-08-30 DIAGNOSIS — R2689 Other abnormalities of gait and mobility: Secondary | ICD-10-CM

## 2016-08-30 DIAGNOSIS — G8929 Other chronic pain: Secondary | ICD-10-CM

## 2016-08-30 NOTE — Therapy (Addendum)
[place] [facility] [address] [place], [place], [address] Phone: [phone]   Fax:  [phone]  Physical Therapy Treatment  Patient Details  Name: Ashley Serrano MRN: [id number] Date of Birth: July 25, 1955 Referring Provider: Ollen Gross  Encounter Date: 08/30/2016      PT End of Session - 08/30/16 0817    Visit Number 9   Number of Visits 12   Date for PT Re-Evaluation 08/18/16   Authorization Type BCBS   Authorization - Visit Number 9   Authorization - Number of Visits 12   PT Start Time 0817   PT Stop Time 0858   PT Time Calculation (min) 41 min   Activity Tolerance Patient tolerated treatment well;No increased pain   Behavior During Therapy WFL for tasks assessed/performed      Past Medical History:  Diagnosis Date  . Multiple gastric ulcers     History reviewed. No pertinent surgical history.  There were no vitals filed for this visit.      Subjective Assessment - 08/30/16 0817    Subjective Pt states that she has been stiff from head to toe. She reports intermittent compliance with her HEP but when she does them they are going well. Otherwise, she has been doing well.    Pertinent History unremarkable.    Patient Stated Goals improved walking, pain to be down, be able to go up and down steps easier,    Currently in Pain? Yes   Pain Score 5    Pain Location Knee   Pain Orientation Left   Pain Descriptors / Indicators Tightness   Pain Type Acute pain   Pain Onset More than a month ago   Pain Frequency Constant   Aggravating Factors  bending, terminal extension   Pain Relieving Factors ibuprofen, ice   Effect of Pain on Daily Activities increases              [facility] Adult PT Treatment/Exercise - 08/30/16 0001      Knee/Hip Exercises: Stretches   Active Hamstring Stretch Left;3 reps;30 seconds   Active Hamstring Stretch Limitations standing with foot elevated on step     Knee/Hip Exercises: Standing    Knee Flexion Left;15 reps   Knee Flexion Limitations 5-10 sec holds each   Forward Lunges Both;2 sets;10 reps   Forward Lunges Limitations cues for proper technique   Hip Abduction Stengthening;Both;2 sets;10 reps   SLS BLE on foam 5 x 10-15 sec holds each   SLS with Vectors BLE on foam with intermittent UE support x 5 reps each   Other Standing Knee Exercises side stepping with RTB 30ft x 2 RT     Knee/Hip Exercises: Seated   Stool Scoot - Round Trips 71ft x 2RT               PT Short Term Goals - 08/16/16 0830      PT SHORT TERM GOAL #1   Title Pt pain to be no greater than a 5/10 to allow pt to stand for 15 minutes to socialize without left knee pain.    Time 2   Period Weeks   Status Achieved     PT SHORT TERM GOAL #2   Title Pt to be able to single leg stance for 10 seconds bilaterally to reduce risk of falling.    Time 2   Period Weeks   Status Achieved     PT SHORT TERM GOAL #3   Title Pt Lt knee  flexion to be to 100 to be able descend steps with increased ease.    Baseline 5/3: 100 AROM this date   Time 2   Period Weeks   Status Achieved           PT Long Term Goals - 08/16/16 [id number]      PT LONG TERM GOAL #1   Title Pt to have a normalized gait to reduce stress on her hips and back    Time 4   Period Weeks   Status On-going     PT LONG TERM GOAL #2   Title PT Lt knee ROM to be to 115 to allow pt to pick items off the ground with ease    Baseline 5/3: 100 AROM, 109 AAROM   Time 4   Period Weeks   Status On-going     PT LONG TERM GOAL #3   Title PT to be able to be on her feet for up to 3.5 hours for work duties.    Time 4   Period Weeks   Status Achieved     PT LONG TERM GOAL #4   Title Pt to be able to single leg stance on foam for up to 10 seconds to allow pt to feel comfortable walking on the beach.    Baseline 5/3: RLE: 3 seconds or <, LLE: 6 seconds or <   Time 4   Period Weeks   Status On-going               Plan -  08/30/16 0857    Clinical Impression Statement Pt presents to therapy with c/o continued stiffness and tightness in her L knee. 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 She did well with therex and functional strengthening this date but did require cues throughout for proper technique. Updated pt's HEP to include hip strengthening which she tolerated well. She reported improved stiffness at EOS. Continue to focus on functional BLE and core strengthening.   Rehab Potential Good   PT Frequency Biweekly   PT Duration 8 weeks   PT Treatment/Interventions ADLs/Self Care Home Management;Stair training;Gait training;Therapeutic exercise;Therapeutic activities;Balance training;Patient/family education;Manual techniques   PT Next Visit Plan Biweekly visits -- update HEP each session to promote knee flexion and ensure compliance with HEP. practice split squats down to floor with LLE forward (simulate work duties), continue stool scoots, knee drives, gait training, stepping over hurdles   PT Home Exercise Plan 5/3: pt stated she was performing prone quad stretch, heel slides, prone TKE, quad set, wall walking with feet, stationary bike, stairs; added knee drives on step, squats at kitchen counter, seated knee flexion   Consulted and Agree with Plan of Care Patient      Patient will benefit from skilled therapeutic intervention in order to improve the following deficits and impairments:  Abnormal gait, Decreased activity tolerance, Decreased balance, Decreased range of motion, Decreased strength, Difficulty walking, Increased edema, Pain  Visit Diagnosis: Chronic pain of left knee  Stiffness of left knee, not elsewhere classified  Localized edema  Other abnormalities of gait and mobility     Problem List There are no active problems to display for this patient.    Jac CanavanBrooke Powell PT, DPT   [facility] [address] [place], [place], [address] Phone: [phone]   Fax:  [phone]  Name: Ashley Serrano MRN: [id number] Date of Birth: 04/08/1956

## 2016-09-13 ENCOUNTER — Ambulatory Visit (HOSPITAL_COMMUNITY): Payer: BLUE CROSS/BLUE SHIELD

## 2016-09-13 DIAGNOSIS — M25562 Pain in left knee: Secondary | ICD-10-CM

## 2016-09-13 DIAGNOSIS — R600 Localized edema: Secondary | ICD-10-CM

## 2016-09-13 DIAGNOSIS — R2689 Other abnormalities of gait and mobility: Secondary | ICD-10-CM

## 2016-09-13 DIAGNOSIS — G8929 Other chronic pain: Secondary | ICD-10-CM

## 2016-09-13 DIAGNOSIS — M25662 Stiffness of left knee, not elsewhere classified: Secondary | ICD-10-CM

## 2016-09-13 NOTE — Therapy (Signed)
[facility] [address] [place], [place], [address] Phone: [phone]   Fax:  [phone]  Physical Therapy Treatment  Patient Details  Name: Ashley Serrano MRN: [id number] Date of Birth: 10-18-1955 Referring Provider: Ollen GrossFrank Aluisio  Encounter Date: 09/13/2016      PT End of Session - 09/13/16 0823    Visit Number 10   Number of Visits 12   Authorization Type BCBS   Authorization - Visit Number 10   Authorization - Number of Visits 12   PT Start Time 0816   PT Stop Time 0901   PT Time Calculation (min) 45 min   Activity Tolerance Patient tolerated treatment well;No increased pain   Behavior During Therapy WFL for tasks assessed/performed      Past Medical History:  Diagnosis Date  . Multiple gastric ulcers     No past surgical history on file.  There were no vitals filed for this visit.      Subjective Assessment - 09/13/16 0819    Subjective Pt stated knee is stiff today.  Reports doing a lot of stairs this past weekend.  current pain scale 3/10   Pertinent History unremarkable.    Patient Stated Goals improved walking, pain to be down, be able to go up and down steps easier,    Currently in Pain? Yes   Pain Score 3    Pain Location Knee   Pain Orientation Left   Pain Descriptors / Indicators Aching;Tightness  Stiffness   Pain Type Acute pain   Pain Onset More than a month ago   Pain Frequency Constant   Aggravating Factors  bending, terminal extension   Pain Relieving Factors ibuprofen, ice   Effect of Pain on Daily Activities increases                         [facility] Adult PT Treatment/Exercise - 09/13/16 0001      Knee/Hip Exercises: Stretches   LobbyistQuad Stretch 3 reps;30 seconds   Quad Stretch Limitations prone with sheet   Knee: Self-Stretch to increase Flexion 10 seconds   Knee: Self-Stretch Limitations knee drives on 8in step 10x10" for flexion     Knee/Hip Exercises: Standing   Forward Lunges Both;2 sets;10 reps   Forward Lunges Limitations cues for proper technique; foam Rt LE WB with lunges   Functional Squat 10 reps   Functional Squat Limitations cueing for appropriate form/mechanics split stance to increase Lt LE    SLS with Vectors BLE on foam with intermittent UE support x 5 reps each   Other Standing Knee Exercises side stepping with GTB 4015ft x 2 RT   Other Standing Knee Exercises squats with hands on counter x 10 holding for 3 seconds     Knee/Hip Exercises: Seated   Stool Scoot - Round Trips 5415ft x 2RT     Knee/Hip Exercises: Supine   Other Supine Knee/Hip Exercises floor to standing with 1 UE A (green mat) x 2                  PT Short Term Goals - 08/16/16 0830      PT SHORT TERM GOAL #1   Title Pt pain to be no greater than a 5/10 to allow pt to stand for 15 minutes to socialize without left knee pain.    Time 2   Period Weeks   Status Achieved     PT SHORT TERM GOAL #2  Title Pt to be able to single leg stance for 10 seconds bilaterally to reduce risk of falling.    Time 2   Period Weeks   Status Achieved     PT SHORT TERM GOAL #3   Title Pt Lt knee flexion to be to 100 to be able descend steps with increased ease.    Baseline 5/3: 100 AROM this date   Time 2   Period Weeks   Status Achieved           PT Long Term Goals - 08/16/16 6962      PT LONG TERM GOAL #1   Title Pt to have a normalized gait to reduce stress on her hips and back    Time 4   Period Weeks   Status On-going     PT LONG TERM GOAL #2   Title PT Lt knee ROM to be to 115 to allow pt to pick items off the ground with ease    Baseline 5/3: 100 AROM, 109 AAROM   Time 4   Period Weeks   Status On-going     PT LONG TERM GOAL #3   Title PT to be able to be on her feet for up to 3.5 hours for work duties.    Time 4   Period Weeks   Status Achieved     PT LONG TERM GOAL #4   Title Pt to be able to single leg stance on foam for up to 10 seconds  to allow pt to feel comfortable walking on the beach.    Baseline 5/3: RLE: 3 seconds or <, LLE: 6 seconds or <   Time 4   Period Weeks   Status On-going               Plan - 09/13/16 1027    Clinical Impression Statement Session focus on functional strengthening with RTW activities to improve functional strengthening and improve knee mobilty.  Therapist facilitation to improve form with kneeling and squats for increased ease and to reduce stress on knee with activities.  No reports of increased pain through session.     Rehab Potential Good   PT Frequency Biweekly   PT Duration 8 weeks   PT Treatment/Interventions ADLs/Self Care Home Management;Stair training;Gait training;Therapeutic exercise;Therapeutic activities;Balance training;Patient/family education;Manual techniques   PT Next Visit Plan Biweekly visits -- update HEP each session to promote knee flexion and ensure compliance with HEP. practice split squats down to floor with LLE forward (simulate work duties), continue stool scoots, knee drives, gait training, stepping over hurdles   PT Home Exercise Plan 5/3: pt stated she was performing prone quad stretch, heel slides, prone TKE, quad set, wall walking with feet, stationary bike, stairs; added knee drives on step, squats at kitchen counter, seated knee flexion      Patient will benefit from skilled therapeutic intervention in order to improve the following deficits and impairments:  Abnormal gait, Decreased activity tolerance, Decreased balance, Decreased range of motion, Decreased strength, Difficulty walking, Increased edema, Pain  Visit Diagnosis: Stiffness of left knee, not elsewhere classified  Localized edema  Other abnormalities of gait and mobility  Chronic pain of left knee     Problem List There are no active problems to display for this patient.  [address] LPTA; CBIS [phone]  Juel Burrow 09/13/2016, 10:30 AM  [facility] [address] [place], [place], [address] Phone: [phone]   Fax:  [phone]  Name: Ashley Serrano MRN: [id number] Date of Birth: [birth year]

## 2016-09-27 ENCOUNTER — Encounter (HOSPITAL_COMMUNITY): Payer: Self-pay

## 2016-09-27 ENCOUNTER — Ambulatory Visit (HOSPITAL_COMMUNITY): Payer: BLUE CROSS/BLUE SHIELD | Attending: Orthopedic Surgery

## 2016-09-27 DIAGNOSIS — M25562 Pain in left knee: Secondary | ICD-10-CM | POA: Diagnosis present

## 2016-09-27 DIAGNOSIS — G8929 Other chronic pain: Secondary | ICD-10-CM | POA: Insufficient documentation

## 2016-09-27 DIAGNOSIS — M25662 Stiffness of left knee, not elsewhere classified: Secondary | ICD-10-CM | POA: Diagnosis not present

## 2016-09-27 DIAGNOSIS — R600 Localized edema: Secondary | ICD-10-CM | POA: Diagnosis present

## 2016-09-27 DIAGNOSIS — R2689 Other abnormalities of gait and mobility: Secondary | ICD-10-CM | POA: Diagnosis present

## 2016-09-27 NOTE — Therapy (Signed)
[place], [place], [address] Phone: [phone]   Fax:  [phone]  Physical Therapy Treatment/Discharge Summary  Patient Details  Name: Ashley Serrano MRN: [id number] Date of Birth: September 27, 1955 Referring Provider: Gaynelle Arabian  Encounter Date: 09/27/2016      PT End of Session - 09/27/16 0817    Visit Number 11   Number of Visits 12   Authorization Type [employer]   Authorization - Visit Number 11   Authorization - Number of Visits 12   PT Start Time 0815   PT Stop Time 0823   PT Time Calculation (min) 8 min   Activity Tolerance Patient tolerated treatment well;No increased pain   Behavior During Therapy WFL for tasks assessed/performed      Past Medical History:  Diagnosis Date  . Multiple gastric ulcers     History reviewed. No pertinent surgical history.  There were no vitals filed for this visit.      Subjective Assessment - 09/27/16 0816    Subjective Pt states that her knee is feeling pretty good. She has not been doing her HEP like she should becuase her R ankle has been acting up.    Pertinent History unremarkable.    Patient Stated Goals improved walking, pain to be down, be able to go up and down steps easier,    Currently in Pain? No/denies   Pain Onset More than a month ago            [facility] PT Assessment - 09/27/16 0001      AROM   Left Knee Flexion 87             PT Education - 09/27/16 0830    Education provided Yes   Education Details discharge plans, continue HEP, can return with referral if she notices a significant decline in function   Person(s) Educated Patient   Methods Explanation   Comprehension Verbalized understanding          PT Short Term Goals - 09/27/16 0817      PT SHORT TERM GOAL #1   Title Pt pain to be no greater than a 5/10 to allow pt to stand for 15 minutes to socialize without left knee pain.    Time 2   Period Weeks   Status Achieved      PT SHORT TERM GOAL #2   Title Pt to be able to single leg stance for 10 seconds bilaterally to reduce risk of falling.    Time 2   Period Weeks   Status Achieved     PT SHORT TERM GOAL #3   Title Pt Lt knee flexion to be to 100 to be able descend steps with increased ease.    Baseline 5/3: 100 AROM this date   Time 2   Period Weeks   Status Achieved           PT Long Term Goals - 09/27/16 0818      PT LONG TERM GOAL #1   Title Pt to have a normalized gait to reduce stress on her hips and back    Time 4   Period Weeks   Status On-going     PT LONG TERM GOAL #2   Title PT Lt knee ROM to be to 115 to allow pt to pick items off the ground with ease    Baseline 6/14: 87 deg flexion this date   Time 4   Period Weeks  Status On-going     PT LONG TERM GOAL #3   Title PT to be able to be on her feet for up to 3.5 hours for work duties.    Time 4   Period Weeks   Status Achieved     PT LONG TERM GOAL #4   Title Pt to be able to single leg stance on foam for up to 10 seconds to allow pt to feel comfortable walking on the beach.    Baseline 6/14: RLE: 2 seconds or <, LLE: 3 seconds or <   Time 4   Period Weeks   Status On-going               Plan - 09/27/16 3295    Clinical Impression Statement PT checked pt's goals this date. Pt has met all STG, 1 LTG, and all others are on-going. Pt stated she felt like she was "at least 90% improved" since starting therapy, and reported that the remaining 10% is that she "does not let herself do what I know I can do." Pt stated that she has no issues performing work or household duties anymore. Due to progress made, pt will be discharged to her HEP. She was encouraged to return to her normal routine, prior to her surgery and she verbalized understanding. PT educated pt that she could return with referral if she noticed a significant decline in her function. Pt verbalized understanding and was agreeable to d/c.   Rehab Potential  Good   PT Frequency Biweekly   PT Duration 8 weeks   PT Treatment/Interventions ADLs/Self Care Home Management;Stair training;Gait training;Therapeutic exercise;Therapeutic activities;Balance training;Patient/family education;Manual techniques   PT Next Visit Plan Biweekly visits -- update HEP each session to promote knee flexion and ensure compliance with HEP. practice split squats down to floor with LLE forward (simulate work duties), continue stool scoots, knee drives, gait training, stepping over hurdles   PT Home Exercise Plan 5/3: pt stated she was performing prone quad stretch, heel slides, prone TKE, quad set, wall walking with feet, stationary bike, stairs; added knee drives on step, squats at kitchen counter, seated knee flexion   Consulted and Agree with Plan of Care Patient      Patient will benefit from skilled therapeutic intervention in order to improve the following deficits and impairments:  Abnormal gait, Decreased activity tolerance, Decreased balance, Decreased range of motion, Decreased strength, Difficulty walking, Increased edema, Pain  Visit Diagnosis: Stiffness of left knee, not elsewhere classified  Localized edema  Other abnormalities of gait and mobility  Chronic pain of left knee     Problem List There are no active problems to display for this patient.    PHYSICAL THERAPY DISCHARGE SUMMARY  Visits from Start of Care: 11  Current functional level related to goals / functional outcomes: See clinical impression above.   Remaining deficits: Deficits in gait and AROM remain   Education / Equipment: Continue HEP, can return with referral  Plan: Patient agrees to discharge.  Patient goals were partially met. Patient is being discharged due to being pleased with the current functional level.  ?????      Geraldine Solar PT, [place] [address] [place], [place], [address] Phone: [phone]    Fax:  [phone]  Name: Ashley Serrano MRN: [id number] Date of Birth: 07/12/1955

## 2016-10-10 ENCOUNTER — Telehealth (HOSPITAL_COMMUNITY): Payer: Self-pay | Admitting: Family Medicine

## 2016-10-10 NOTE — Telephone Encounter (Signed)
10/10/16 pt left a message that the appt for 6/28 was supposed to have been cancelled

## 2016-10-11 ENCOUNTER — Ambulatory Visit (HOSPITAL_COMMUNITY): Payer: BLUE CROSS/BLUE SHIELD

## 2017-02-03 IMAGING — DX DG KNEE COMPLETE 4+V*L*
4 series · 4 of 4 positions shown · non-contrast
Comparison: None.

CLINICAL DATA: 59 y/o female with LEFT knee pain 1+ years (pain
mostly anterior), NKI. Swelling since [REDACTED]. Very limited ROM, can
not bend knee much (the images shown were of the most she could bend
knee). States knee is feeling numb today. No known injury.

EXAM:
LEFT KNEE - COMPLETE 4+ VIEW

[knee ap]
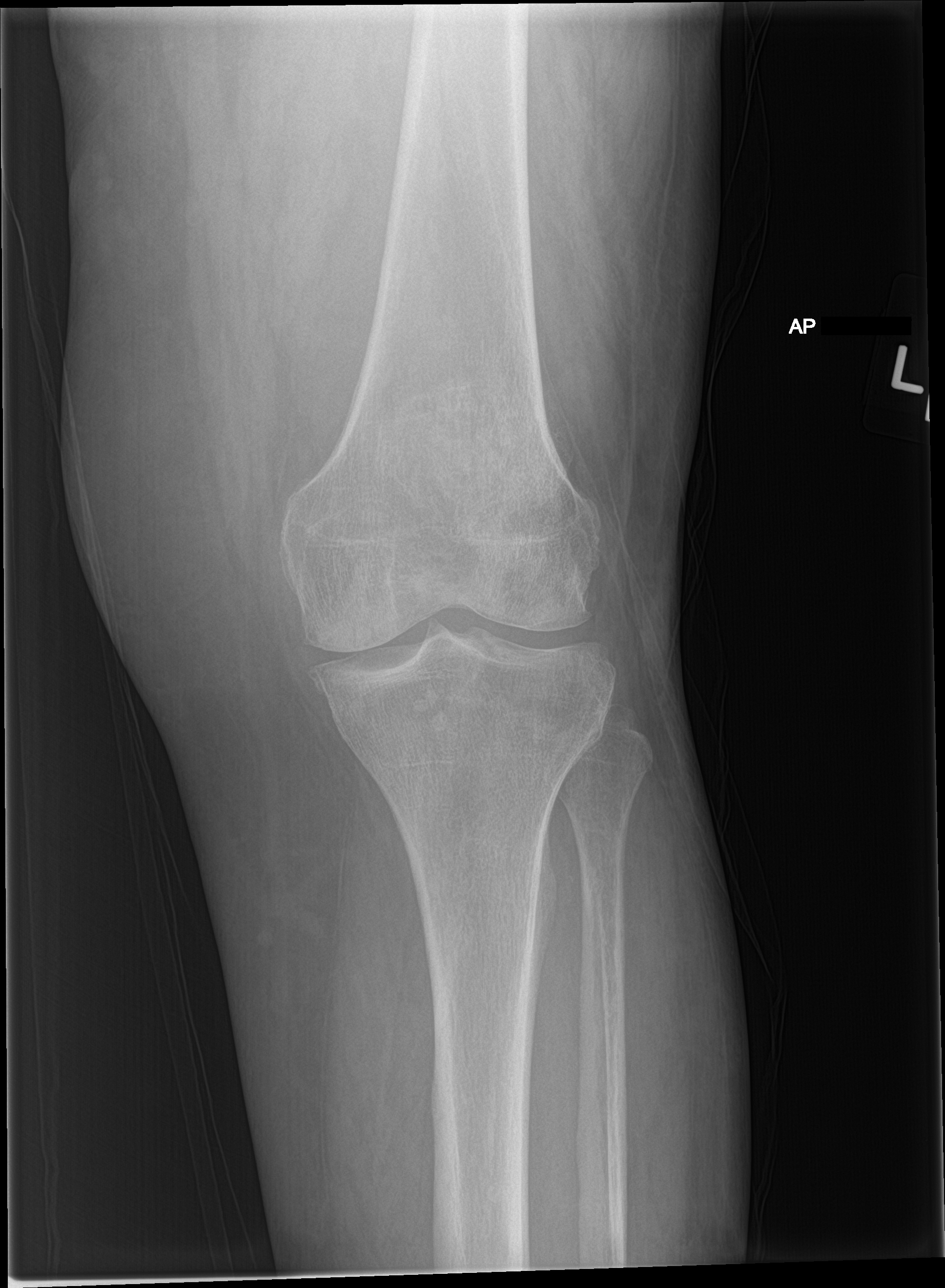

[tunnel]
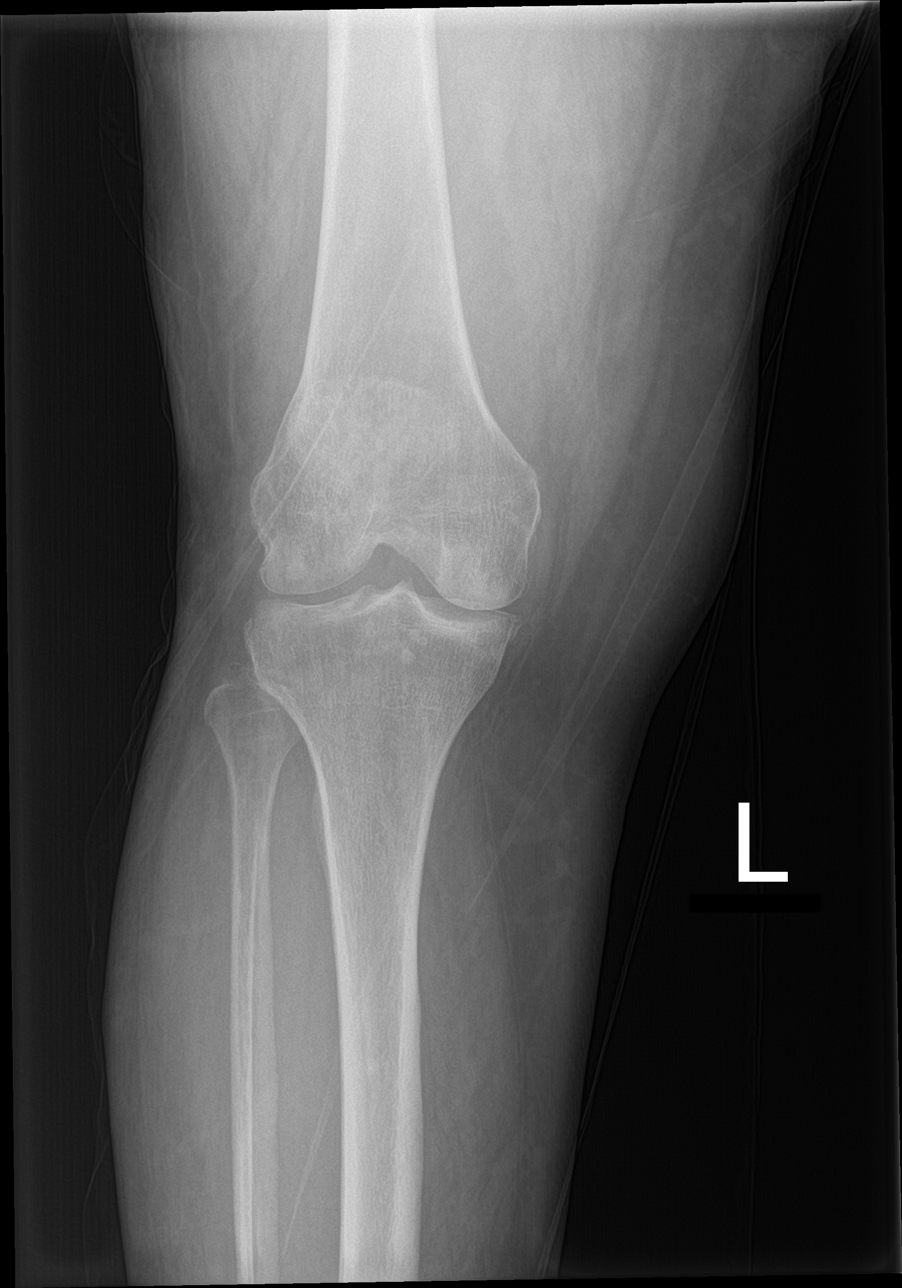

[knee lat]
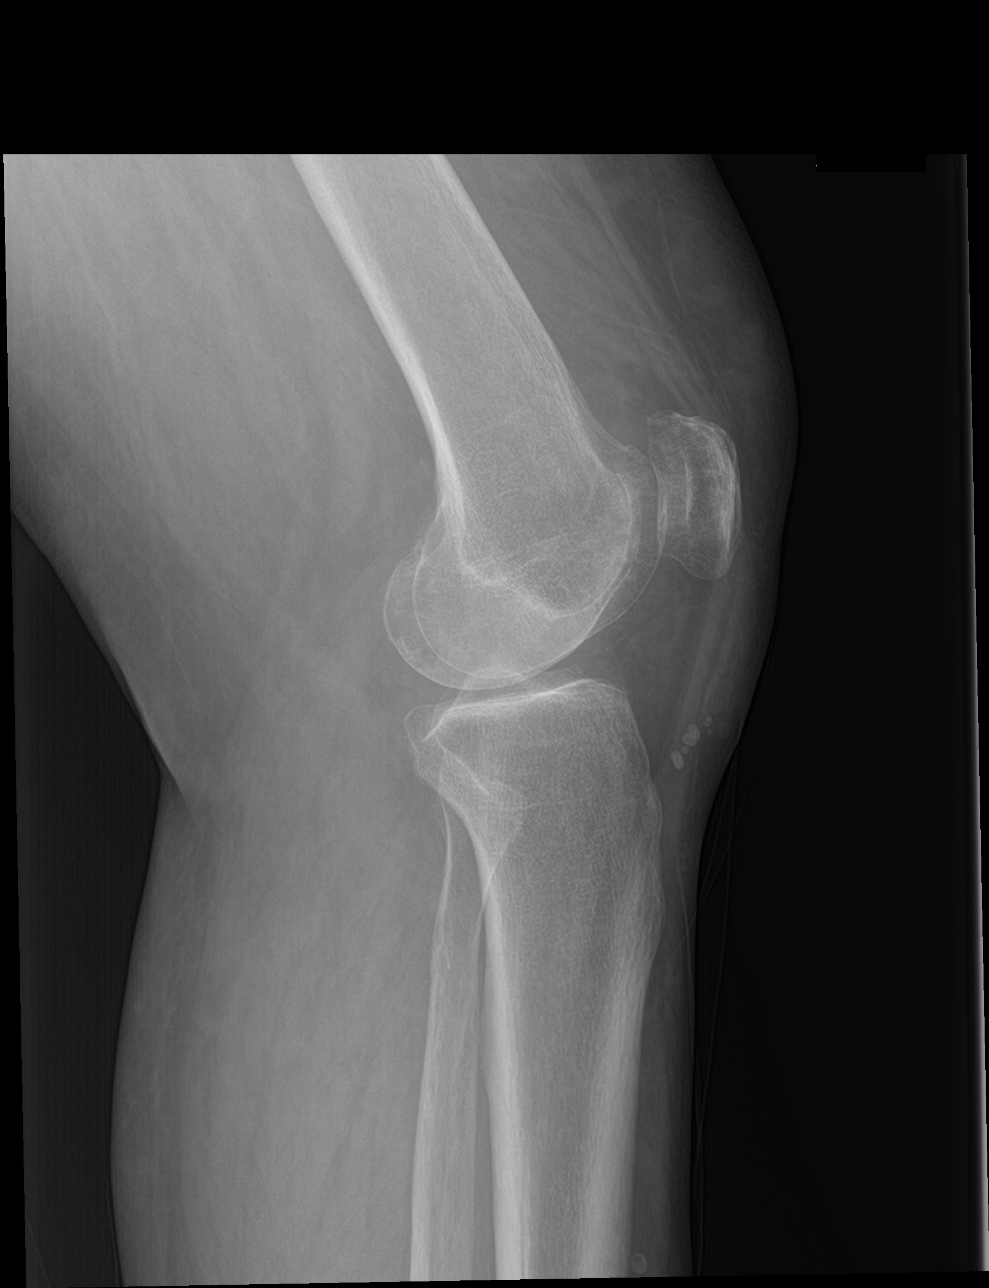

[knee sunrise]
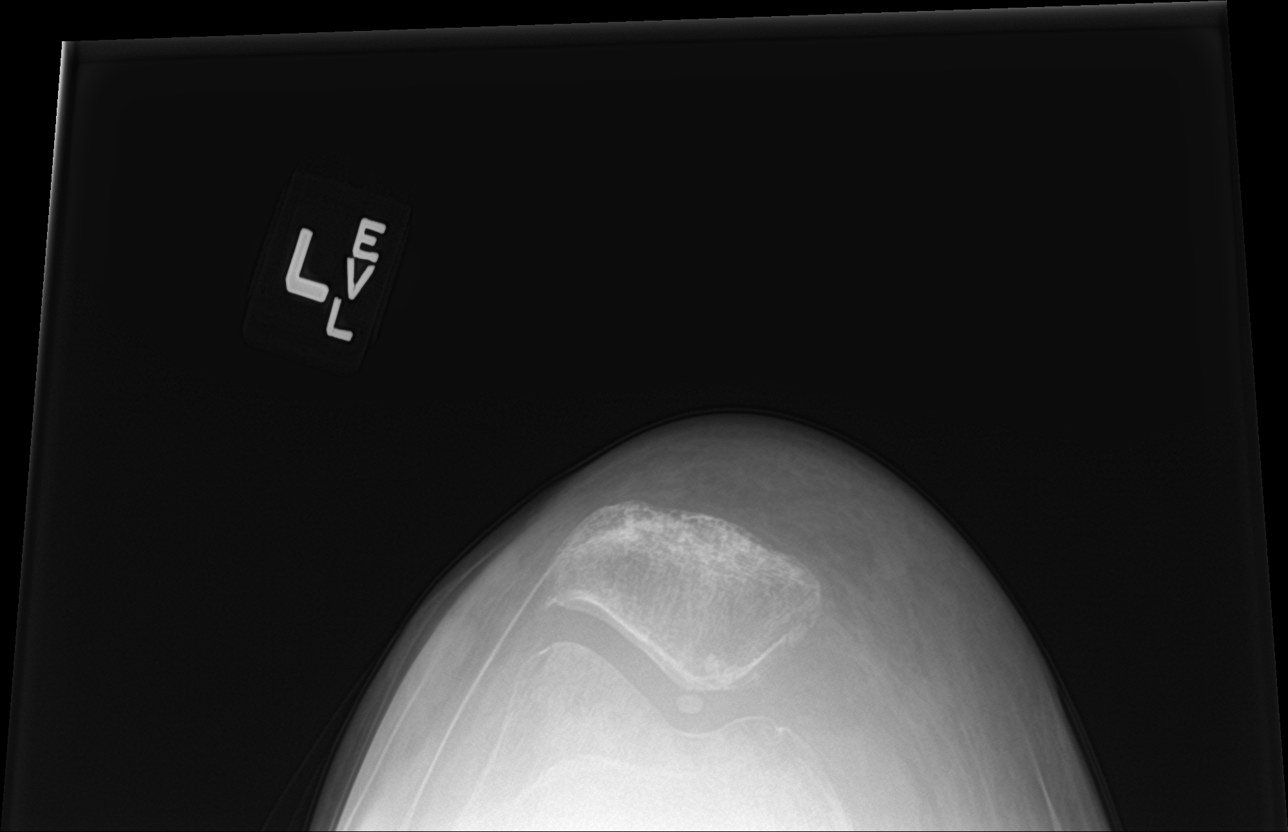

[4 of 4 positions shown; findings below may reference images not displayed]

FINDINGS: There is mild degenerative change involving the medial and
patellofemoral compartments. No joint effusion or acute fracture.
Soft tissue swelling is identified along the anterior aspect of the
knee. Calcifications are identified within the patellar tendon.
IMPRESSION: 1. Soft tissue swelling.
2. Calcific patellar tendinosis.

## 2019-05-11 ENCOUNTER — Other Ambulatory Visit: Payer: Self-pay

## 2019-05-11 ENCOUNTER — Ambulatory Visit: Payer: BC Managed Care – PPO | Attending: Internal Medicine

## 2019-05-11 DIAGNOSIS — Z20822 Contact with and (suspected) exposure to covid-19: Secondary | ICD-10-CM

## 2019-05-12 LAB — NOVEL CORONAVIRUS, NAA: SARS-CoV-2, NAA: NOT DETECTED

## 2020-12-14 ENCOUNTER — Other Ambulatory Visit (HOSPITAL_COMMUNITY): Payer: Self-pay | Admitting: Nurse Practitioner

## 2020-12-14 DIAGNOSIS — Z1231 Encounter for screening mammogram for malignant neoplasm of breast: Secondary | ICD-10-CM

## 2021-05-24 ENCOUNTER — Encounter (HOSPITAL_COMMUNITY): Payer: Self-pay

## 2021-05-24 ENCOUNTER — Emergency Department (HOSPITAL_COMMUNITY)
Admission: EM | Admit: 2021-05-24 | Discharge: 2021-05-24 | Disposition: A | Discharge status: Home / Self Care | Payer: 59 | Attending: Emergency Medicine | Admitting: Emergency Medicine

## 2021-05-24 ENCOUNTER — Other Ambulatory Visit: Payer: Self-pay

## 2021-05-24 DIAGNOSIS — R739 Hyperglycemia, unspecified: Secondary | ICD-10-CM | POA: Diagnosis not present

## 2021-05-24 DIAGNOSIS — T528X1A Toxic effect of other organic solvents, accidental (unintentional), initial encounter: Secondary | ICD-10-CM

## 2021-05-24 DIAGNOSIS — T59891A Toxic effect of other specified gases, fumes and vapors, accidental (unintentional), initial encounter: Secondary | ICD-10-CM | POA: Insufficient documentation

## 2021-05-24 DIAGNOSIS — R0602 Shortness of breath: Secondary | ICD-10-CM | POA: Diagnosis not present

## 2021-05-24 LAB — I-STAT CHEM 8, ED
BUN: 20 mg/dL (ref 8–23)
Calcium, Ion: 1.2 mmol/L (ref 1.15–1.40)
Chloride: 104 mmol/L (ref 98–111)
Creatinine, Ser: 0.7 mg/dL (ref 0.44–1.00)
Glucose, Bld: 121 mg/dL — ABNORMAL HIGH (ref 70–99)
HCT: 39 % (ref 36.0–46.0)
Hemoglobin: 13.3 g/dL (ref 12.0–15.0)
Potassium: 4.2 mmol/L (ref 3.5–5.1)
Sodium: 139 mmol/L (ref 135–145)
TCO2: 29 mmol/L (ref 22–32)

## 2021-05-24 NOTE — ED Triage Notes (Signed)
Pt states she accidentally ingested antifreeze around 0430 this morning. States she was outside smoking and thought she picked up a water bottle and it was antifreeze instead. Pt states she believes she only ingested a very small amount.

## 2021-05-24 NOTE — ED Provider Notes (Signed)
[facility] EMERGENCY DEPARTMENT Provider Note   CSN: [id number] Arrival date & time: 05/24/21  0550     History  Chief Complaint  Patient presents with   Ingestion    Antifreeze     Ashley Serrano is a 66 y.o. female.  The history is provided by the patient and a relative.  Ingestion This is a new problem. The current episode started 1 to 2 hours ago. The problem occurs constantly. The problem has not changed since onset.Associated symptoms include shortness of breath. Pertinent negatives include no chest pain and no abdominal pain. Nothing aggravates the symptoms. Nothing relieves the symptoms.  Patient reports she went to bed with water bottle.  She woke up around 430 and went out to her smoking table and grabbed a bottle to drink.  She took 1 sip and realized that it was not water and spit it out immediately.  She reports she may have swallowed a small amount.  She vomited soon after.  She reports that her granddaughter had just purchased antifreeze  No other acute complaints at this time.  No pain complaints reported.  No difficulty swallowing   Past Medical History:  Diagnosis Date   Multiple gastric ulcers     Home Medications Prior to Admission medications   Medication Sig Start Date End Date Taking? Authorizing Provider  ALPRAZolam Prudy Feeler) 0.25 MG tablet Take 0.25 mg by mouth at bedtime as needed for anxiety.    [provider]  ibuprofen (ADVIL,MOTRIN) 200 MG tablet Take 200 mg by mouth 2 (two) times daily.    [provider]      Allergies    Patient has no known allergies.    Review of Systems   Review of Systems  Constitutional:  Negative for fever.  HENT:  Negative for trouble swallowing.   Respiratory:  Positive for shortness of breath.        Chronic shortness of breath  Cardiovascular:  Negative for chest pain.  Gastrointestinal:  Positive for vomiting. Negative for abdominal pain.  Psychiatric/Behavioral:  Negative for self-injury  and suicidal ideas.   All other systems reviewed and are negative.  Physical Exam Updated Vital Signs BP (!) 171/79    Pulse 81    Temp 97.8 F (36.6 C)    Resp (!) 21    Ht 1.626 m (5\' 4" )    Wt 108.9 kg    SpO2 97%    BMI 41.20 kg/m  Physical Exam CONSTITUTIONAL: Well developed/well nourished HEAD: Normocephalic/atraumatic EYES: EOMI/PERRL ENMT: Mucous membranes moist, uvula midline, no erythema, no angioedema, no stridor, no drooling, no dysphonia NECK: supple no meningeal signs CV: S1/S2 noted, no murmurs/rubs/gallops noted LUNGS: Lungs are clear to auscultation bilaterally, no apparent distress ABDOMEN: soft, nontender NEURO: Pt is awake/alert/appropriate, moves all extremitiesx4.  No facial droop.   EXTREMITIES: full ROM SKIN: warm, color normal PSYCH: no abnormalities of mood noted, alert and oriented to situation  ED Results / Procedures / Treatments   Labs (all labs ordered are listed, but only abnormal results are displayed) Labs Reviewed  I-STAT CHEM 8, ED - Abnormal; Notable for the following components:      Result Value   Glucose, Bld 121 (*)    All other components within normal limits    EKG None  Radiology No results found.  Procedures Procedures    Medications Ordered in ED Medications - No data to display  ED Course/ Medical Decision Making/ A&P  Medical Decision Making  Discussed the case with  poison control. Since this was an accidental poisoning and patient ingested a very small amount, they do not recommend extensive testing in the setting of normal renal function. Patient has no recent labs, will check a Chem-8 and reassess. 6:51 AM Labs revealing mild hyperglycemia.  No renal dysfunction. Patient adamantly denies SI.  She reports she only swallowed a very small amount She is safe for discharge home.  We discussed strict return precautions       Final Clinical Impression(s) / ED Diagnoses Final  diagnoses:  Ethylene glycol poisoning, accidental or unintentional, initial encounter    Rx / DC Orders ED Discharge Orders     None         Zadie Rhine, MD 05/24/21 [phone]

## 2021-05-24 NOTE — Discharge Instructions (Signed)
Drink plenty of clear fluids today. If you have any weakness, nausea, vomiting in the next 24 hours please return to the ER

## 2021-11-09 ENCOUNTER — Ambulatory Visit
Admission: EM | Admit: 2021-11-09 | Discharge: 2021-11-09 | Disposition: A | Discharge status: Home / Self Care | Payer: 59

## 2021-11-09 DIAGNOSIS — B349 Viral infection, unspecified: Secondary | ICD-10-CM

## 2021-11-09 DIAGNOSIS — J309 Allergic rhinitis, unspecified: Secondary | ICD-10-CM | POA: Diagnosis not present

## 2021-11-09 LAB — POCT RAPID STREP A (OFFICE): Rapid Strep A Screen: NEGATIVE

## 2021-11-09 MED ORDER — FLUTICASONE PROPIONATE 50 MCG/ACT NA SUSP: 2.0000 | Freq: Every day | NASAL | 0 refills | Status: DC

## 2021-11-09 MED ORDER — PSEUDOEPH-BROMPHEN-DM 30-2-10 MG/5ML PO SYRP: 5.0000 mL | ORAL_SOLUTION | Freq: Four times a day (QID) | ORAL | 0 refills | Status: DC | PRN

## 2021-11-09 NOTE — Discharge Instructions (Signed)
Your rapid strep test is negative.  As discussed, I have ordered a throat culture and COVID/flu test.  If either of the results are positive, you will be contacted. Take medication as prescribed.  Begin taking your Claritin daily while symptoms persist. Increase fluids and allow for plenty of rest.  Try to drink at least 6-8 8 ounce glasses of water daily. Recommend Tylenol or ibuprofen as needed for pain, fever, or general discomfort. Warm salt water gargles 3-4 times daily to help with throat pain or Recommend using a humidifier at bedtime during sleep to help with cough and nasal congestion. Sleep elevated on 2 pillows while cough symptoms persist. Follow-up in this clinic or with your PCP if symptoms do not improve.Marland Kitchen

## 2021-11-09 NOTE — ED Provider Notes (Addendum)
[facility]    CSN: [id number] Arrival date & time: 11/09/21  1742      History   Chief Complaint Chief Complaint  Patient presents with   Fatigue        Sore Throat         HPI Ashley Serrano is a 66 y.o. female.   The history is provided by the patient.   Patient presents with a 2-day history of fatigue, sinus drainage, cough, and sore throat.  She also states that she had episodes of feeling "hot and cold".  She denies headache, nasal congestion, runny nose, wheezing, shortness of breath, or difficulty breathing.  Patient has a history of seasonal allergies.  States that she takes Claritin when she remembers.  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  She has not taken any other medication for her symptoms.  Past Medical History:  Diagnosis Date   Multiple gastric ulcers     There are no problems to display for this patient.   History reviewed. No pertinent surgical history.  OB History   No obstetric history on file.      Home Medications    Prior to Admission medications   Medication Sig Start Date End Date Taking? Authorizing Provider  brompheniramine-pseudoephedrine-DM 30-2-10 MG/5ML syrup Take 5 mLs by mouth 4 (four) times daily as needed. 11/09/21  Yes Philana Younis-Warren, Sadie Haber, NP  fluticasone (FLONASE) 50 MCG/ACT nasal spray Place 2 sprays into both nostrils daily. 11/09/21  Yes Zannah Melucci-Warren, Sadie Haber, NP  ALPRAZolam Prudy Feeler) 0.25 MG tablet Take 0.25 mg by mouth at bedtime as needed for anxiety.    [provider]  ibuprofen (ADVIL,MOTRIN) 200 MG tablet Take 200 mg by mouth 2 (two) times daily.    [provider]  meloxicam (MOBIC) 7.5 MG tablet Take 7.5 mg by mouth every morning. 08/04/21   [provider]  pantoprazole (PROTONIX) 40 MG tablet Take 40 mg by mouth daily. 10/30/21   [provider]    Family History Family History  Problem Relation Age  of Onset   Cancer Mother    Thyroid disease Mother    Hypertension Father    Alcoholism Sister    Seizures Sister    Arthritis Sister    Cancer Brother    Arthritis Brother    Ulcers Maternal Grandmother     Social History Social History   Tobacco Use   Smoking status: Every Day    Packs/day: 1.00    Types: Cigarettes   Smokeless tobacco: Never  Substance Use Topics   Drug use: Never     Allergies   Patient has no known allergies.   Review of Systems Review of Systems HPI  Physical Exam Triage Vital Signs ED Triage Vitals  Enc Vitals Group     BP 11/09/21 1757 (!) 168/74     Pulse Rate 11/09/21 1757 (!) 111     Resp 11/09/21 1757 (!) 26     Temp 11/09/21 1757 98.1 F (36.7 C)     Temp Source 11/09/21 1757 Oral     SpO2 11/09/21 1757 93 %     Weight --      Height --      Head Circumference --      Peak Flow --      Pain Score 11/09/21 1756 4     Pain Loc --      Pain Edu? --  Excl. in GC? --    No data found.  Updated Vital Signs BP (!) 168/74 (BP Location: Right Arm)   Pulse (!) 111   Temp 98.1 F (36.7 C) (Oral)   Resp (!) 26   SpO2 93%   Visual Acuity Right Eye Distance:   Left Eye Distance:   Bilateral Distance:    Right Eye Near:   Left Eye Near:    Bilateral Near:     Physical Exam Vitals reviewed.  Constitutional:      General: She is not in acute distress.    Appearance: She is well-developed.  HENT:     Head: Normocephalic and atraumatic.     Right Ear: Tympanic membrane and ear canal normal.     Left Ear: Tympanic membrane and ear canal normal.     Nose: No congestion.     Right Turbinates: Enlarged and swollen.     Left Turbinates: Enlarged and swollen.     Right Sinus: No maxillary sinus tenderness or frontal sinus tenderness.     Left Sinus: No maxillary sinus tenderness or frontal sinus tenderness.     Mouth/Throat:     Lips: Pink.     Mouth: Mucous membranes are moist.     Pharynx: Uvula midline. Pharyngeal  swelling and posterior oropharyngeal erythema present. No uvula swelling.     Tonsils: No tonsillar exudate. 1+ on the right. 1+ on the left.     Comments: Cobblestoning seen on oropharynx Eyes:     Conjunctiva/sclera: Conjunctivae normal.  Cardiovascular:     Rate and Rhythm: Regular rhythm. Tachycardia present.     Heart sounds: Normal heart sounds.  Pulmonary:     Effort: Pulmonary effort is normal.     Breath sounds: Normal breath sounds.  Abdominal:     General: Bowel sounds are normal. There is no distension.     Palpations: Abdomen is soft.     Tenderness: There is no abdominal tenderness. There is no guarding or rebound.  Genitourinary:    Vagina: Normal. No vaginal discharge.  Musculoskeletal:     Cervical back: Normal range of motion.  Lymphadenopathy:     Cervical: No cervical adenopathy.  Skin:    General: Skin is warm and dry.     Findings: No erythema or rash.  Neurological:     General: No focal deficit present.     Mental Status: She is alert and oriented to person, place, and time.     Cranial Nerves: No cranial nerve deficit.  Psychiatric:        Mood and Affect: Mood normal.        Behavior: Behavior normal.      UC Treatments / Results  Labs (all labs ordered are listed, but only abnormal results are displayed) Labs Reviewed  COVID-19, FLU A+B NAA  CULTURE, GROUP A STREP [facility])  POCT RAPID STREP A (OFFICE)    EKG   Radiology No results found.  Procedures Procedures (including critical care time)  Medications Ordered in UC Medications - No data to display  Initial Impression / Assessment and Plan / UC Course  I have reviewed the triage vital signs and the nursing notes.  Pertinent labs & imaging results that were available during my care of the patient were reviewed by me and considered in my medical decision making (see chart for details).  Patient presents for complaints of fatigue, sore throat, and mild cough that been present for the  past 2 days.  On  exam, patient's vital signs show that she is tachycardic and tachypneic, but she is in no acute distress during the exam.  Her lung sounds are clear throughout.  Rapid strep test is negative, COVID/flu and throat culture are pending at this time.  Differential diagnoses include acute pharyngitis, sinusitis, viral upper respiratory infection, and allergic rhinitis.  Will provide patient symptomatic treatment with Bromfed and fluticasone for her symptoms.  Supportive care recommendations were also provided to the patient to include increasing her fluids and getting plenty of rest.  Patient was advised that she will be contacted if her pending test results are positive.  Patient advised to follow-up in this clinic or with her primary care physician if symptoms do not improve. Final Clinical Impressions(s) / UC Diagnoses   Final diagnoses:  Viral illness  Allergic rhinitis, unspecified seasonality, unspecified trigger     Discharge Instructions      Your rapid strep test is negative.  As discussed, I have ordered a throat culture and COVID/flu test.  If either of the results are positive, you will be contacted. Take medication as prescribed.  Begin taking your Claritin daily while symptoms persist. Increase fluids and allow for plenty of rest.  Try to drink at least 6-8 8 ounce glasses of water daily. Recommend Tylenol or ibuprofen as needed for pain, fever, or general discomfort. Warm salt water gargles 3-4 times daily to help with throat pain or Recommend using a humidifier at bedtime during sleep to help with cough and nasal congestion. Sleep elevated on 2 pillows while cough symptoms persist. Follow-up in this clinic or with your PCP if symptoms do not improve..      ED Prescriptions     Medication Sig Dispense Auth. Provider   brompheniramine-pseudoephedrine-DM 30-2-10 MG/5ML syrup Take 5 mLs by mouth 4 (four) times daily as needed. 140 mL Jasmin Winberry-Warren, Sadie Haber, NP    fluticasone (FLONASE) 50 MCG/ACT nasal spray Place 2 sprays into both nostrils daily. 16 g Kaleem Sartwell-Warren, Sadie Haber, NP      PDMP not reviewed this encounter.   Abran Cantor, NP 11/09/21 1811    Abran Cantor, NP 11/09/21 1858    Abran Cantor, NP 11/09/21 1859

## 2021-11-09 NOTE — ED Triage Notes (Signed)
Pt reports fatigue, "feeling very tired", sinus drainage x 2 days; sore throat x 1 day. Pt reports she has being exposed to Strep.

## 2021-11-10 LAB — COVID-19, FLU A+B NAA
Influenza A, NAA: NOT DETECTED
Influenza B, NAA: NOT DETECTED
SARS-CoV-2, NAA: DETECTED — AB

## 2022-02-10 ENCOUNTER — Encounter: Payer: Self-pay | Admitting: Emergency Medicine

## 2022-02-10 ENCOUNTER — Ambulatory Visit
Admission: EM | Admit: 2022-02-10 | Discharge: 2022-02-10 | Disposition: A | Discharge status: Home / Self Care | Payer: 59 | Attending: Nurse Practitioner | Admitting: Nurse Practitioner

## 2022-02-10 DIAGNOSIS — R0981 Nasal congestion: Secondary | ICD-10-CM | POA: Insufficient documentation

## 2022-02-10 DIAGNOSIS — R0989 Other specified symptoms and signs involving the circulatory and respiratory systems: Secondary | ICD-10-CM | POA: Diagnosis present

## 2022-02-10 DIAGNOSIS — J029 Acute pharyngitis, unspecified: Secondary | ICD-10-CM | POA: Insufficient documentation

## 2022-02-10 DIAGNOSIS — R059 Cough, unspecified: Secondary | ICD-10-CM | POA: Insufficient documentation

## 2022-02-10 DIAGNOSIS — Z1152 Encounter for screening for COVID-19: Secondary | ICD-10-CM | POA: Insufficient documentation

## 2022-02-10 DIAGNOSIS — Z8709 Personal history of other diseases of the respiratory system: Secondary | ICD-10-CM

## 2022-02-10 LAB — RESP PANEL BY RT-PCR (FLU A&B, COVID) ARPGX2
Influenza A by PCR: NEGATIVE
Influenza B by PCR: NEGATIVE
SARS Coronavirus 2 by RT PCR: NEGATIVE

## 2022-02-10 MED ORDER — PROMETHAZINE-DM 6.25-15 MG/5ML PO SYRP: 5.0000 mL | ORAL_SOLUTION | Freq: Four times a day (QID) | ORAL | 0 refills | Status: DC | PRN

## 2022-02-10 MED ORDER — CETIRIZINE HCL 10 MG PO TABS: 10.0000 mg | ORAL_TABLET | Freq: Every day | ORAL | 0 refills | Status: AC

## 2022-02-10 NOTE — Discharge Instructions (Addendum)
COVID test is pending. You will be contacted if the results of the test are positive. As discussed you are a candidate to receive Paxlovid.  Take medication as directed. Increase fluids and get plenty of rest. May take over-the-counter ibuprofen or Tylenol as needed for pain, fever, or general discomfort. Recommend normal saline nasal spray to help with nasal congestion throughout the day. For your cough, it may be helpful to use a humidifier at bedtime during sleep. If your symptoms continue to persist or suddenly worsen, please follow-up in this clinic or with your primary care physician.

## 2022-02-10 NOTE — ED Provider Notes (Signed)
[facility]    CSN: [id number] Arrival date & time: 02/10/22  1249      History   Chief Complaint No chief complaint on file.   HPI Ashley Serrano is a 66 y.o. female.   The history is provided by the patient.   She presents with a 4-day history of sore throat, nasal congestion, nasal drainage, chest congestion, and productive cough.  Patient denies fever, chills, headache, wheezing, shortness of breath, difficulty breathing, or GI symptoms.  Patient has been taking Tylenol Cold and flu and Mucinex without relief.  Patient has a history of seasonal allergies.  Reports she takes Claritin daily for her allergies.  Patient denies any known sick contacts.  Patient has not been vaccinated for COVID.  Past Medical History:  Diagnosis Date   Multiple gastric ulcers     There are no problems to display for this patient.   History reviewed. No pertinent surgical history.  OB History   No obstetric history on file.      Home Medications    Prior to Admission medications   Medication Sig Start Date End Date Taking? Authorizing Provider  ALPRAZolam Duanne Moron) 0.25 MG tablet Take 0.25 mg by mouth at bedtime as needed for anxiety.    [provider]  brompheniramine-pseudoephedrine-DM 30-2-10 MG/5ML syrup Take 5 mLs by mouth 4 (four) times daily as needed. 11/09/21   Chaun Uemura-Warren, Alda Lea, NP  fluticasone (FLONASE) 50 MCG/ACT nasal spray Place 2 sprays into both nostrils daily. 11/09/21   Gracelyn Coventry-Warren, Alda Lea, NP  ibuprofen (ADVIL,MOTRIN) 200 MG tablet Take 200 mg by mouth 2 (two) times daily.    [provider]  meloxicam (MOBIC) 7.5 MG tablet Take 7.5 mg by mouth every morning. 08/04/21   [provider]  pantoprazole (PROTONIX) 40 MG tablet Take 40 mg by mouth daily. 10/30/21   [provider]    Family History Family History  Problem Relation Age of Onset   Cancer Mother    Thyroid disease Mother    Hypertension Father     Alcoholism Sister    Seizures Sister    Arthritis Sister    Cancer Brother    Arthritis Brother    Ulcers Maternal Grandmother     Social History Social History   Tobacco Use   Smoking status: Every Day    Packs/day: 1.00    Types: Cigarettes   Smokeless tobacco: Never  Substance Use Topics   Drug use: Never     Allergies   Patient has no known allergies.   Review of Systems Review of Systems Per HPI  Physical Exam Triage Vital Signs ED Triage Vitals  Enc Vitals Group     BP 02/10/22 1350 (!) 159/75     Pulse Rate 02/10/22 1350 86     Resp 02/10/22 1350 18     Temp 02/10/22 1350 98.7 F (37.1 C)     Temp Source 02/10/22 1350 Oral     SpO2 02/10/22 1350 94 %     Weight --      Height --      Head Circumference --      Peak Flow --      Pain Score 02/10/22 1351 0     Pain Loc --      Pain Edu? --      Excl. in [place]? --    No data found.  Updated Vital Signs BP (!) 159/75 (BP Location: Right Arm)   Pulse 86  Temp 98.7 F (37.1 C) (Oral)   Resp 18   SpO2 94%   Visual Acuity Right Eye Distance:   Left Eye Distance:   Bilateral Distance:    Right Eye Near:   Left Eye Near:    Bilateral Near:     Physical Exam Vitals and nursing note reviewed.  Constitutional:      General: She is not in acute distress.    Appearance: Normal appearance.  HENT:     Head: Normocephalic.     Right Ear: Tympanic membrane, ear canal and external ear normal.     Left Ear: Tympanic membrane, ear canal and external ear normal.     Nose: Congestion present. No rhinorrhea.     Mouth/Throat:     Mouth: Mucous membranes are moist.     Pharynx: Posterior oropharyngeal erythema present.     Comments: Cobblestoning present on posterior tongue Eyes:     Extraocular Movements: Extraocular movements intact.     Conjunctiva/sclera: Conjunctivae normal.     Pupils: Pupils are equal, round, and reactive to light.  Cardiovascular:     Rate and Rhythm: Normal rate and regular  rhythm.     Pulses: Normal pulses.     Heart sounds: Normal heart sounds.  Pulmonary:     Effort: Pulmonary effort is normal. No respiratory distress.     Breath sounds: Normal breath sounds. No stridor. No wheezing, rhonchi or rales.  Abdominal:     General: Bowel sounds are normal.     Palpations: Abdomen is soft.     Tenderness: There is no abdominal tenderness.  Musculoskeletal:     Cervical back: Normal range of motion.  Lymphadenopathy:     Cervical: No cervical adenopathy.  Skin:    General: Skin is warm and dry.  Neurological:     General: No focal deficit present.     Mental Status: She is alert and oriented to person, place, and time.  Psychiatric:        Mood and Affect: Mood normal.        Behavior: Behavior normal.      UC Treatments / Results  Labs (all labs ordered are listed, but only abnormal results are displayed) Labs Reviewed - No data to display  EKG   Radiology No results found.  Procedures Procedures (including critical care time)  Medications Ordered in UC Medications - No data to display  Initial Impression / Assessment and Plan / UC Course  I have reviewed the triage vital signs and the nursing notes.  Pertinent labs & imaging results that were available during my care of the patient were reviewed by me and considered in my medical decision making (see chart for details).  COVID test is pending.  Patient would like to be started on Paxlovid if the pending test is positive.  Patient is well-appearing, she is in no acute distress.  She is hypertensive, but otherwise stable.  Differential diagnoses include allergic rhinitis versus upper respiratory infection versus COVID.  In the interim, patient was prescribed Promethazine DM for her cough and cetirizine 10 mg for possible allergy symptoms.  Patient has Flonase at home that she was instructed to begin using.  Patient was given supportive care recommendations.  Patient verbalizes understanding.   All questions were answered.  Patient is stable for discharge. Final Clinical Impressions(s) / UC Diagnoses   Final diagnoses:  None   Discharge Instructions   None    ED Prescriptions   None  PDMP not reviewed this encounter.   Abran Cantor, NP 02/10/22 1414

## 2022-02-10 NOTE — ED Triage Notes (Signed)
Sore throat, nasal drainage, productive cough with green sputum, chest congestion x 4 days.  Has been taking tylenol cold and flu, and mucinex without relief

## 2022-07-13 ENCOUNTER — Ambulatory Visit
Admission: EM | Admit: 2022-07-13 | Discharge: 2022-07-13 | Disposition: A | Discharge status: Home / Self Care | Payer: 59 | Attending: Family Medicine | Admitting: Family Medicine

## 2022-07-13 DIAGNOSIS — L03312 Cellulitis of back [any part except buttock]: Secondary | ICD-10-CM

## 2022-07-13 MED ORDER — CEPHALEXIN 500 MG PO CAPS: 500.0000 mg | ORAL_CAPSULE | Freq: Two times a day (BID) | ORAL | 0 refills | Status: DC

## 2022-07-13 MED ORDER — TRIAMCINOLONE ACETONIDE 0.1 % EX CREA: 1.0000 | TOPICAL_CREAM | Freq: Two times a day (BID) | CUTANEOUS | 0 refills | Status: DC

## 2022-07-13 NOTE — ED Provider Notes (Signed)
[facility]    CSN: [id number] Arrival date & time: 07/13/22  [id number]      History   Chief Complaint Chief Complaint  Patient presents with   Mass    Bump on back     HPI Ashley Serrano is a 67 y.o. female.   Patient presenting today with 3-week history of a small bump that occurred on the left upper back that has now become more swollen, red and painful over the last day or so.  She denies any drainage, fever, chills, known injury to the area.  So far trying warm compresses and alcohol to the area with no relief.    Past Medical History:  Diagnosis Date   Multiple gastric ulcers     There are no problems to display for this patient.   History reviewed. No pertinent surgical history.  OB History   No obstetric history on file.      Home Medications    Prior to Admission medications   Medication Sig Start Date End Date Taking? Authorizing Provider  cephALEXin (KEFLEX) 500 MG capsule Take 1 capsule (500 mg total) by mouth 2 (two) times daily. 07/13/22  Yes Volney American, PA-C  triamcinolone cream (KENALOG) 0.1 % Apply 1 Application topically 2 (two) times daily. 07/13/22  Yes Volney American, PA-C  ALPRAZolam Duanne Moron) 0.25 MG tablet Take 0.25 mg by mouth at bedtime as needed for anxiety.    [provider]  brompheniramine-pseudoephedrine-DM 30-2-10 MG/5ML syrup Take 5 mLs by mouth 4 (four) times daily as needed. 11/09/21   Leath-Warren, Alda Lea, NP  cetirizine (ZYRTEC) 10 MG tablet Take 1 tablet (10 mg total) by mouth daily. 02/10/22   Leath-Warren, Alda Lea, NP  fluticasone (FLONASE) 50 MCG/ACT nasal spray Place 2 sprays into both nostrils daily. 11/09/21   Leath-Warren, Alda Lea, NP  ibuprofen (ADVIL,MOTRIN) 200 MG tablet Take 200 mg by mouth 2 (two) times daily.    [provider]  meloxicam (MOBIC) 7.5 MG tablet Take 7.5 mg by mouth every morning. 08/04/21   [provider]  pantoprazole (PROTONIX) 40 MG  tablet Take 40 mg by mouth daily. 10/30/21   [provider]  promethazine-dextromethorphan (PROMETHAZINE-DM) 6.25-15 MG/5ML syrup Take 5 mLs by mouth 4 (four) times daily as needed for cough. 02/10/22   Leath-Warren, Alda Lea, NP    Family History Family History  Problem Relation Age of Onset   Cancer Mother    Thyroid disease Mother    Hypertension Father    Alcoholism Sister    Seizures Sister    Arthritis Sister    Cancer Brother    Arthritis Brother    Ulcers Maternal Grandmother     Social History Social History   Tobacco Use   Smoking status: Every Day    Packs/day: 1    Types: Cigarettes   Smokeless tobacco: Never  Substance Use Topics   Drug use: Never     Allergies   Patient has no known allergies.   Review of Systems Review of Systems Per HPI  Physical Exam Triage Vital Signs ED Triage Vitals  Enc Vitals Group     BP 07/13/22 1030 (!) 144/86     Pulse Rate 07/13/22 1030 96     Resp 07/13/22 1030 17     Temp 07/13/22 1030 97.8 F (36.6 C)     Temp Source 07/13/22 1030 Oral     SpO2 07/13/22 1030 95 %     Weight --  Height --      Head Circumference --      Peak Flow --      Pain Score 07/13/22 1029 8     Pain Loc --      Pain Edu? --      Excl. in [place]? --    No data found.  Updated Vital Signs BP (!) 144/86 (BP Location: Right Arm)   Pulse 96   Temp 97.8 F (36.6 C) (Oral)   Resp 17   SpO2 95%   Visual Acuity Right Eye Distance:   Left Eye Distance:   Bilateral Distance:    Right Eye Near:   Left Eye Near:    Bilateral Near:     Physical Exam Vitals and nursing note reviewed.  Constitutional:      Appearance: Normal appearance. She is not ill-appearing.  HENT:     Head: Atraumatic.  Eyes:     Extraocular Movements: Extraocular movements intact.     Conjunctiva/sclera: Conjunctivae normal.  Cardiovascular:     Rate and Rhythm: Normal rate and regular rhythm.     Heart sounds: Normal heart sounds.   Pulmonary:     Effort: Pulmonary effort is normal.     Breath sounds: Normal breath sounds.  Musculoskeletal:        General: Normal range of motion.     Cervical back: Normal range of motion and neck supple.  Skin:    General: Skin is warm and dry.     Findings: Erythema present.     Comments: 3 cm region of erythema, firm tenderness to palpation to left upper back with central ulceration.  No fluctuance, induration, drainage  Neurological:     Mental Status: She is alert and oriented to person, place, and time.     Motor: No weakness.     Gait: Gait normal.  Psychiatric:        Mood and Affect: Mood normal.        Thought Content: Thought content normal.        Judgment: Judgment normal.      UC Treatments / Results  Labs (all labs ordered are listed, but only abnormal results are displayed) Labs Reviewed - No data to display  EKG   Radiology No results found.  Procedures Procedures (including critical care time)  Medications Ordered in UC Medications - No data to display  Initial Impression / Assessment and Plan / UC Course  I have reviewed the triage vital signs and the nursing notes.  Pertinent labs & imaging results that were available during my care of the patient were reviewed by me and considered in my medical decision making (see chart for details).     Triamcinolone sent for itching, and will treat with antibiotics, warm compresses for possible cellulitis.  Follow-up for worsening symptoms.  No indication for I&D today.  Final Clinical Impressions(s) / UC Diagnoses   Final diagnoses:  Cellulitis of back     Discharge Instructions      Apply the steroid cream topically twice daily for itching as needed, warm rags off-and-on and take the full course of antibiotics.  Follow-up if worsening at any time.    ED Prescriptions     Medication Sig Dispense Auth. Provider   triamcinolone cream (KENALOG) 0.1 % Apply 1 Application topically 2 (two)  times daily. 30 g Volney American, [place]   cephALEXin (KEFLEX) 500 MG capsule Take 1 capsule (500 mg total) by mouth 2 (two) times daily.  14 capsule Volney American, [place]      PDMP not reviewed this encounter.   Volney American, [place] 07/13/22 1055

## 2022-07-13 NOTE — ED Triage Notes (Signed)
Pt c/o bump on left side of the back that appeared 3 weeks ago. Causes constant burning sensation at the sight of the bump. Cannot lay on her back.

## 2022-07-13 NOTE — Discharge Instructions (Signed)
Apply the steroid cream topically twice daily for itching as needed, warm rags off-and-on and take the full course of antibiotics.  Follow-up if worsening at any time.

## 2022-07-19 ENCOUNTER — Ambulatory Visit
Admission: EM | Admit: 2022-07-19 | Discharge: 2022-07-19 | Disposition: A | Discharge status: Home / Self Care | Payer: 59 | Attending: Nurse Practitioner | Admitting: Nurse Practitioner

## 2022-07-19 DIAGNOSIS — L02212 Cutaneous abscess of back [any part, except buttock]: Secondary | ICD-10-CM

## 2022-07-19 MED ORDER — SULFAMETHOXAZOLE-TRIMETHOPRIM 800-160 MG PO TABS: 1.0000 | ORAL_TABLET | Freq: Two times a day (BID) | ORAL | 0 refills | Status: AC

## 2022-07-19 NOTE — ED Provider Notes (Signed)
[facility]    CSN: [id number] Arrival date & time: 07/19/22  [id number]      History   Chief Complaint No chief complaint on file.   HPI Ashley Serrano is a 67 y.o. female.   The history is provided by the patient.   Patient presents for follow-up for cellulitis of her back.  Patient was seen on 3/29 and presented with a small bump on her back that have been present for the past 3 weeks.  At that time, the area on her back was approximately the centimeters.  Patient states prescribed Keflex and has been taking the medication twice daily as directed.  She states last evening, the area on her back became more painful.  She states that she applied warm compresses, and around 2 or 3 AM this morning, the area became more painful.  She states that it also started draining "pinkish-white" fluid.  She states when she got to work this morning, she also applied another warm compress.  She states that since that time, the area appears to have gotten larger and has become more painful.  She states that she felt like she had a fever since her symptoms began to worsen.  She denies chills, chest pain, abdominal pain, nausea, vomiting, or diarrhea. Past Medical History:  Diagnosis Date   Multiple gastric ulcers     There are no problems to display for this patient.   History reviewed. No pertinent surgical history.  OB History   No obstetric history on file.      Home Medications    Prior to Admission medications   Medication Sig Start Date End Date Taking? Authorizing Provider  sulfamethoxazole-trimethoprim (BACTRIM DS) 800-160 MG tablet Take 1 tablet by mouth 2 (two) times daily for 7 days. 07/19/22 07/26/22 Yes Mackinsey Pelland-Warren, Alda Lea, NP  ALPRAZolam Duanne Moron) 0.25 MG tablet Take 0.25 mg by mouth at bedtime as needed for anxiety.    [provider]  brompheniramine-pseudoephedrine-DM 30-2-10 MG/5ML syrup Take 5 mLs by mouth 4 (four) times daily as needed. 11/09/21    Burr Soffer-Warren, Alda Lea, NP  cephALEXin (KEFLEX) 500 MG capsule Take 1 capsule (500 mg total) by mouth 2 (two) times daily. 07/13/22   Volney American, PA-C  cetirizine (ZYRTEC) 10 MG tablet Take 1 tablet (10 mg total) by mouth daily. 02/10/22   Roanna Reaves-Warren, Alda Lea, NP  fluticasone (FLONASE) 50 MCG/ACT nasal spray Place 2 sprays into both nostrils daily. 11/09/21   Bevin Das-Warren, Alda Lea, NP  ibuprofen (ADVIL,MOTRIN) 200 MG tablet Take 200 mg by mouth 2 (two) times daily.    [provider]  meloxicam (MOBIC) 7.5 MG tablet Take 7.5 mg by mouth every morning. 08/04/21   [provider]  pantoprazole (PROTONIX) 40 MG tablet Take 40 mg by mouth daily. 10/30/21   [provider]  promethazine-dextromethorphan (PROMETHAZINE-DM) 6.25-15 MG/5ML syrup Take 5 mLs by mouth 4 (four) times daily as needed for cough. 02/10/22   Karolee Meloni-Warren, Alda Lea, NP  triamcinolone cream (KENALOG) 0.1 % Apply 1 Application topically 2 (two) times daily. 07/13/22   Volney American, PA-C    Family History Family History  Problem Relation Age of Onset   Cancer Mother    Thyroid disease Mother    Hypertension Father    Alcoholism Sister    Seizures Sister    Arthritis Sister    Cancer Brother    Arthritis Brother    Ulcers Maternal Grandmother     Social History Social  History   Tobacco Use   Smoking status: Every Day    Packs/day: 1    Types: Cigarettes   Smokeless tobacco: Never  Substance Use Topics   Drug use: Never     Allergies   Patient has no known allergies.   Review of Systems Review of Systems Per HPI  Physical Exam Triage Vital Signs ED Triage Vitals  Enc Vitals Group     BP 07/19/22 0841 (!) 168/76     Pulse Rate 07/19/22 0841 (!) 105     Resp 07/19/22 0841 20     Temp 07/19/22 0841 97.9 F (36.6 C)     Temp Source 07/19/22 0841 Oral     SpO2 07/19/22 0841 95 %     Weight --      Height --      Head Circumference --      Peak  Flow --      Pain Score 07/19/22 0844 8     Pain Loc --      Pain Edu? --      Excl. in [place]? --    No data found.  Updated Vital Signs BP (!) 168/76 (BP Location: Right Arm)   Pulse (!) 105   Temp 97.9 F (36.6 C) (Oral)   Resp 20   SpO2 95%   Visual Acuity Right Eye Distance:   Left Eye Distance:   Bilateral Distance:    Right Eye Near:   Left Eye Near:    Bilateral Near:     Physical Exam Vitals and nursing note reviewed.  Constitutional:      General: She is not in acute distress.    Appearance: Normal appearance.  HENT:     Head: Normocephalic.  Eyes:     Extraocular Movements: Extraocular movements intact.     Conjunctiva/sclera: Conjunctivae normal.     Pupils: Pupils are equal, round, and reactive to light.  Cardiovascular:     Rate and Rhythm: Regular rhythm. Tachycardia present.     Pulses: Normal pulses.     Heart sounds: Normal heart sounds.  Pulmonary:     Effort: Pulmonary effort is normal. No respiratory distress.     Breath sounds: Normal breath sounds. No stridor. No wheezing, rhonchi or rales.  Abdominal:     General: Bowel sounds are normal.     Palpations: Abdomen is soft.     Tenderness: There is no abdominal tenderness.  Musculoskeletal:     Cervical back: Normal range of motion.  Skin:    Findings: Abscess present.     Comments: Patient with an approximate 6 cm erythematous induration to the left mid back under the scapula.  Area is warm to touch.  Area is firm, fluctuance is present.  2 pustules noted in the central region of the induration.  Neurological:     General: No focal deficit present.     Mental Status: She is alert and oriented to person, place, and time.  Psychiatric:        Mood and Affect: Mood normal.        Behavior: Behavior normal.      UC Treatments / Results  Labs (all labs ordered are listed, but only abnormal results are displayed) Labs Reviewed - No data to display  EKG   Radiology No results  found.  Procedures Incision and Drainage  Date/Time: 07/19/2022 9:29 AM  Performed by: Tish Men, NP Authorized by: Tish Men, NP   Consent:  Consent obtained:  Verbal   Consent given by:  Patient   Risks discussed:  Bleeding, incomplete drainage and infection   Alternatives discussed:  No treatment Universal protocol:    Patient identity confirmed:  Verbally with patient Location:    Type:  Abscess   Size:  6 cm   Location:  Trunk   Trunk location:  Back (left mid back) Pre-procedure details:    Skin preparation:  Povidone-iodine (Hibiclens and NS) Anesthesia:    Anesthesia method:  Local infiltration   Local anesthetic:  Lidocaine 2% WITH epi Procedure type:    Complexity:  Simple Procedure details:    Ultrasound guidance: no     Incision types:  Stab incision   Drainage amount:  Copious   Wound treatment:  Wound left open   Packing materials:  None Post-procedure details:    Procedure completion:  Tolerated well, no immediate complications Comments:     Patient with 6 cm induration noted to the left mid back.  Area is fluctuant.  Area was numbed with approximately 5 mL of lidocaine 2% with epi.  Copious amounts of serosanguineous drainage along with keratin expressed.  Patient was able to tolerate the procedure well.  Area was left open to drain.  Antibiotic ointment with gauze dressing and Tegaderm was applied.  (including critical care time)  Medications Ordered in UC Medications - No data to display  Initial Impression / Assessment and Plan / UC Course  I have reviewed the triage vital signs and the nursing notes.  Pertinent labs & imaging results that were available during my care of the patient were reviewed by me and considered in my medical decision making (see chart for details).  The patient was well-appearing, she is hypertensive, and mildly tachycardic.  Patient states that she is in pain due to the abscess on her  back.   Patient with large abscess noted to the left mid back.  Fluctuance was present along with erythema, area has also increased in size.  I&D performed with patient consent.  Large amount of serosanguineous drainage along with keratin removed from the abscess.  Patient tolerated the procedure well.  Dressing was applied.  Patient was started on Bactrim DS 800/160 mg tablets, to take while finishing the Keflex she was previously prescribed.  Patient was given wound care instructions along with strict follow-up precautions versus ER follow-up precautions.  Patient was given instructions to continue applying warm compresses, over-the-counter analgesics such as Tylenol, and keeping the area clean and dry.  Patient is in agreement with this plan of care and verbalizes understanding.  All questions were answered.  Patient stable for discharge.  Final Clinical Impressions(s) / UC Diagnoses   Final diagnoses:  Abscess of upper back excluding scapular region     Discharge Instructions      Take medication as prescribed.  Finish the Keflex that you are currently taking. Warm compresses to the affected area 3-4 times daily. Clean the area at least twice daily with Dial Gold bar soap or another type of antibacterial soap. Keep the area covered while it is draining.  Change the dressing you may need to change the dressing often if the area continues to drain. Follow-up in this clinic if the area fails to heal, the area becomes more red and enlarged, or other concerns. Go to the emergency department if you develop fever, chills, generalized fatigue, nausea, vomiting, or if the area of redness spreads into the genital region, or if you have foul-smelling  drainage.      ED Prescriptions     Medication Sig Dispense Auth. Provider   sulfamethoxazole-trimethoprim (BACTRIM DS) 800-160 MG tablet Take 1 tablet by mouth 2 (two) times daily for 7 days. 14 tablet Antoni Stefan-Warren, Alda Lea, NP      PDMP not  reviewed this encounter.   Tish Men, NP 07/19/22 1029

## 2022-07-19 NOTE — ED Triage Notes (Signed)
Pt reports last night the boil on her back was paining her and keeping her up all night. She put a warm compress on the site and pinkish-white drainage came out.

## 2022-07-19 NOTE — Discharge Instructions (Addendum)
Take medication as prescribed.  Finish the Keflex that you are currently taking. Warm compresses to the affected area 3-4 times daily. Clean the area at least twice daily with Dial Gold bar soap or another type of antibacterial soap. Keep the area covered while it is draining.  Change the dressing you may need to change the dressing often if the area continues to drain. Follow-up in this clinic if the area fails to heal, the area becomes more red and enlarged, or other concerns. Go to the emergency department if you develop fever, chills, generalized fatigue, nausea, vomiting, or if the area of redness spreads into the genital region, or if you have foul-smelling drainage.

## 2022-07-19 NOTE — ED Notes (Addendum)
Applied a non-adherent dressing, antibiotic ointment, and a Tegaderm dressing to the patients upper back. Pt verbalized she understood how to change and how many times to change it.

## 2022-08-03 ENCOUNTER — Ambulatory Visit: Payer: 59 | Admitting: Family Medicine

## 2022-08-03 ENCOUNTER — Encounter: Payer: Self-pay | Admitting: Family Medicine

## 2022-08-03 VITALS — BP 130/77 | HR 66 | Ht 64.0 in | Wt 229.1 lb

## 2022-08-03 DIAGNOSIS — F339 Major depressive disorder, recurrent, unspecified: Secondary | ICD-10-CM

## 2022-08-03 DIAGNOSIS — M25551 Pain in right hip: Secondary | ICD-10-CM | POA: Diagnosis not present

## 2022-08-03 DIAGNOSIS — Z1231 Encounter for screening mammogram for malignant neoplasm of breast: Secondary | ICD-10-CM

## 2022-08-03 DIAGNOSIS — E0789 Other specified disorders of thyroid: Secondary | ICD-10-CM

## 2022-08-03 DIAGNOSIS — G8929 Other chronic pain: Secondary | ICD-10-CM

## 2022-08-03 DIAGNOSIS — E7849 Other hyperlipidemia: Secondary | ICD-10-CM

## 2022-08-03 DIAGNOSIS — E559 Vitamin D deficiency, unspecified: Secondary | ICD-10-CM

## 2022-08-03 DIAGNOSIS — M25552 Pain in left hip: Secondary | ICD-10-CM

## 2022-08-03 DIAGNOSIS — R7301 Impaired fasting glucose: Secondary | ICD-10-CM | POA: Diagnosis not present

## 2022-08-03 DIAGNOSIS — Z1159 Encounter for screening for other viral diseases: Secondary | ICD-10-CM

## 2022-08-03 DIAGNOSIS — Z1211 Encounter for screening for malignant neoplasm of colon: Secondary | ICD-10-CM

## 2022-08-03 DIAGNOSIS — Z23 Encounter for immunization: Secondary | ICD-10-CM

## 2022-08-03 MED ORDER — DULOXETINE HCL 30 MG PO CPEP: 30.0000 mg | ORAL_CAPSULE | Freq: Every day | ORAL | 3 refills | Status: DC

## 2022-08-03 MED ORDER — BUPROPION HCL ER (XL) 150 MG PO TB24: 150.0000 mg | ORAL_TABLET | Freq: Every day | ORAL | 1 refills | Status: DC

## 2022-08-03 NOTE — Assessment & Plan Note (Signed)
Trial on Cymbalta 30 mg  Explained to patient Non pharmacological interventions include the use of ice or heat, rest, recommend range of motion exercises, gentle stretching. Follow up for worsening or persistent symptoms. Patient verbalizes understanding regarding plan of care and all questions answered.

## 2022-08-03 NOTE — Progress Notes (Signed)
Patient Office Visit   Subjective   Patient ID: Ashley Serrano, female    DOB: 16-Oct-1955  Age: 67 y.o. MRN: [id number]  CC:  Chief Complaint  Patient presents with   Establish Care    New patient, establishing care. Left back shoulder blade cyst went to urgent care on 07/19/22, has completed medication course given still red and itchy.     HPI Ashley Serrano 67 year old female, presents to establish care. She  has a past medical history of Multiple gastric ulcers.  Chronic Hip Pain  There was no injury mechanism. The pain is present in the right hip. The quality of the pain is described as aching and cramping. The pain is at a severity of 9/10. The pain has been Fluctuating since onset. Associated symptoms include numbness and tingling. Pertinent negatives include no loss of motion, loss of sensation or muscle weakness. She reports no foreign bodies present.The symptoms are aggravated by weight bearing and movement. She has tried acetaminophen for the symptoms. The treatment provided mild relief.  Depression This is a chronic problem. Patient reports her depression occurs intermittently and has been waxing and waning since onset. Associated symptoms include fatigue, insomnia, irritable, restlessness, decreased interest and sad. Denies suicidal ideas. The symptoms are aggravated by work stress and family issues. Past treatments include nothing. Risk factors include stress, major life event and family history. Past medical history includes chronic pain and anxiety. Pertinent negatives include no suicide attempts.     Outpatient Encounter Medications as of 08/03/2022  Medication Sig   ALPRAZolam (XANAX) 0.25 MG tablet Take 0.25 mg by mouth at bedtime as needed for anxiety.   buPROPion (WELLBUTRIN XL) 150 MG 24 hr tablet Take 1 tablet (150 mg total) by mouth daily.   cetirizine (ZYRTEC) 10 MG tablet Take 1 tablet (10 mg total) by mouth daily.   DULoxetine (CYMBALTA) 30 MG capsule Take 1  capsule (30 mg total) by mouth daily.   [DISCONTINUED] pantoprazole (PROTONIX) 40 MG tablet Take 40 mg by mouth daily.   ibuprofen (ADVIL,MOTRIN) 200 MG tablet Take 200 mg by mouth 2 (two) times daily. (Patient not taking: Reported on 08/03/2022)   [DISCONTINUED] brompheniramine-pseudoephedrine-DM 30-2-10 MG/5ML syrup Take 5 mLs by mouth 4 (four) times daily as needed.   [DISCONTINUED] cephALEXin (KEFLEX) 500 MG capsule Take 1 capsule (500 mg total) by mouth 2 (two) times daily.   [DISCONTINUED] fluticasone (FLONASE) 50 MCG/ACT nasal spray Place 2 sprays into both nostrils daily. (Patient not taking: Reported on 08/03/2022)   [DISCONTINUED] meloxicam (MOBIC) 7.5 MG tablet Take 7.5 mg by mouth every morning. (Patient not taking: Reported on 08/03/2022)   [DISCONTINUED] promethazine-dextromethorphan (PROMETHAZINE-DM) 6.25-15 MG/5ML syrup Take 5 mLs by mouth 4 (four) times daily as needed for cough.   [DISCONTINUED] triamcinolone cream (KENALOG) 0.1 % Apply 1 Application topically 2 (two) times daily. (Patient not taking: Reported on 08/03/2022)   No facility-administered encounter medications on file as of 08/03/2022.    History reviewed. No pertinent surgical history.  Review of Systems  Constitutional:  Negative for chills and fever.  HENT:  Negative for hearing loss.   Eyes:  Negative for blurred vision.  Respiratory:  Negative for shortness of breath.   Cardiovascular:  Negative for chest pain.  Gastrointestinal:  Negative for nausea and vomiting.  Genitourinary:  Negative for dysuria.  Musculoskeletal:  Positive for joint pain and myalgias. Negative for falls.  Skin:  Negative for rash.  Neurological:  Negative for dizziness and  headaches.  Psychiatric/Behavioral:  Positive for depression. The patient is nervous/anxious and has insomnia.       Objective    BP 130/77   Pulse 66   Ht  (1.626 m)   Wt 229 lb 1.9 oz (103.9 kg)   SpO2 95%   BMI 39.33 kg/m   Physical Exam Vitals  reviewed.  Constitutional:      General: She is not in acute distress.    Appearance: Normal appearance. She is not ill-appearing, toxic-appearing or diaphoretic.  HENT:     Head: Normocephalic.  Eyes:     General:        Right eye: No discharge.        Left eye: No discharge.     Conjunctiva/sclera: Conjunctivae normal.  Cardiovascular:     Rate and Rhythm: Normal rate.     Pulses: Normal pulses.     Heart sounds: Normal heart sounds.  Pulmonary:     Effort: Pulmonary effort is normal. No respiratory distress.     Breath sounds: Normal breath sounds.  Abdominal:     General: Bowel sounds are normal.     Palpations: Abdomen is soft.     Tenderness: There is no abdominal tenderness. There is no guarding.  Musculoskeletal:        General: Normal range of motion.     Cervical back: Normal range of motion.  Skin:    General: Skin is warm and dry.     Capillary Refill: Capillary refill takes less than 2 seconds.  Neurological:     General: No focal deficit present.     Mental Status: She is alert and oriented to person, place, and time.     Coordination: Coordination normal.     Gait: Gait normal.  Psychiatric:        Mood and Affect: Mood normal.        Behavior: Behavior normal.       Assessment & Plan:  Bilateral hip pain -     DULoxetine HCl; Take 1 capsule (30 mg total) by mouth daily.  Dispense: 30 capsule; Refill: 3  Vitamin D deficiency -     VITAMIN D 25 Hydroxy (Vit-D Deficiency, Fractures)  Impaired fasting blood sugar -     Hemoglobin A1c  Other specified disorders of thyroid -     TSH + free T4  Other hyperlipidemia -     CBC with Differential/Platelet -     CMP14+EGFR -     Lipid panel  Immunization due -     Tdap vaccine greater than or equal to 7yo IM  Colon cancer screening -     POC FIT Test  Breast cancer screening by mammogram -     3D Screening Mammogram, Left and Right  Encounter for hepatitis C screening test for low risk patient -      Hepatitis C antibody  Depression, recurrent Assessment & Plan: Flowsheet Row Office Visit from 08/03/2022 in [facility]  PHQ-9 Total Score 14      Trial on Wellbutrin 150 mg daily Discussed about cognitive behavioral therapy focusing on thoughts, belief, and attitudes that affects feelings and behavior, learning about coping skills to deal with certain problems. Maintaining a consistent routine and schedule, Practice stress management and self calming techniques, excersise regularly and spend time outdoors, Do not eat food that are high in fat, added sugar, or salt. Follow up in 6 weeks   Orders: -  buPROPion HCl ER (XL); Take 1 tablet (150 mg total) by mouth daily.  Dispense: 30 tablet; Refill: 1  Chronic pain of both hips Assessment & Plan: Trial on Cymbalta 30 mg  Explained to patient Non pharmacological interventions include the use of ice or heat, rest, recommend range of motion exercises, gentle stretching. Follow up for worsening or persistent symptoms. Patient verbalizes understanding regarding plan of care and all questions answered.      Return in about 6 weeks (around 09/14/2022) for Depression.   Cruzita Lederer Newman Nip, FNP

## 2022-08-03 NOTE — Patient Instructions (Addendum)
It was pleasure meeting with you today. Please take medications as prescribed. Follow up with your primary health provider if any health concerns arises. If symptoms worsen please contact your primary care provider and/or visit the emergency department.  

## 2022-08-03 NOTE — Assessment & Plan Note (Signed)
Flowsheet Row Office Visit from 08/03/2022 in [facility]  PHQ-9 Total Score 14      Trial on Wellbutrin 150 mg daily Discussed about cognitive behavioral therapy focusing on thoughts, belief, and attitudes that affects feelings and behavior, learning about coping skills to deal with certain problems. Maintaining a consistent routine and schedule, Practice stress management and self calming techniques, excersise regularly and spend time outdoors, Do not eat food that are high in fat, added sugar, or salt. Follow up in 6 weeks

## 2022-08-09 ENCOUNTER — Other Ambulatory Visit: Payer: Self-pay | Admitting: Family Medicine

## 2022-08-09 MED ORDER — METFORMIN HCL 500 MG PO TABS: 500.0000 mg | ORAL_TABLET | Freq: Every day | ORAL | 3 refills | Status: DC

## 2022-08-13 LAB — CMP14+EGFR
ALT: 12 IU/L (ref 0–32)
AST: 14 IU/L (ref 0–40)
Albumin/Globulin Ratio: 1.7 (ref 1.2–2.2)
Albumin: 4.3 g/dL (ref 3.9–4.9)
Alkaline Phosphatase: 78 IU/L (ref 44–121)
BUN/Creatinine Ratio: 25 (ref 12–28)
BUN: 18 mg/dL (ref 8–27)
Bilirubin Total: <0.2 mg/dL (ref 0.0–1.2)
CO2: 21 mmol/L (ref 20–29)
Calcium: 9.4 mg/dL (ref 8.7–10.3)
Chloride: 98 mmol/L (ref 96–106)
Creatinine, Ser: 0.73 mg/dL (ref 0.57–1.00)
Globulin, Total: 2.5 g/dL (ref 1.5–4.5)
Glucose: 105 mg/dL — ABNORMAL HIGH (ref 70–99)
Potassium: 4.7 mmol/L (ref 3.5–5.2)
Sodium: 136 mmol/L (ref 134–144)
Total Protein: 6.8 g/dL (ref 6.0–8.5)
eGFR: 92 mL/min/{1.73_m2} (ref >59)

## 2022-08-13 LAB — LIPID PANEL
Chol/HDL Ratio: 4.2 ratio (ref 0.0–4.4)
Cholesterol, Total: 217 mg/dL — ABNORMAL HIGH (ref 100–199)
HDL: 52 mg/dL (ref >39)
LDL Chol Calc (NIH): 130 mg/dL — ABNORMAL HIGH (ref 0–99)
Triglycerides: 200 mg/dL — ABNORMAL HIGH (ref 0–149)
VLDL Cholesterol Cal: 35 mg/dL (ref 5–40)

## 2022-08-13 LAB — CBC WITH DIFFERENTIAL/PLATELET
Basophils Absolute: 0.1 10*3/uL (ref 0.0–0.2)
Basos: 1 %
EOS (ABSOLUTE): 0.1 10*3/uL (ref 0.0–0.4)
Eos: 1 %
Hematocrit: 41.6 % (ref 34.0–46.6)
Hemoglobin: 13.5 g/dL (ref 11.1–15.9)
Immature Grans (Abs): 0 10*3/uL (ref 0.0–0.1)
Immature Granulocytes: 0 %
Lymphocytes Absolute: 1.6 10*3/uL (ref 0.7–3.1)
Lymphs: 26 %
MCH: 27 pg (ref 26.6–33.0)
MCHC: 32.5 g/dL (ref 31.5–35.7)
MCV: 83 fL (ref 79–97)
Monocytes Absolute: 0.4 10*3/uL (ref 0.1–0.9)
Monocytes: 7 %
Neutrophils Absolute: 4 10*3/uL (ref 1.4–7.0)
Neutrophils: 65 %
Platelets: 301 10*3/uL (ref 150–450)
RBC: 5 x10E6/uL (ref 3.77–5.28)
RDW: 15.4 % (ref 11.7–15.4)
WBC: 6.2 10*3/uL (ref 3.4–10.8)

## 2022-08-13 LAB — TSH+FREE T4
Free T4: 1.38 ng/dL (ref 0.82–1.77)
TSH: 1.45 u[IU]/mL (ref 0.450–4.500)

## 2022-08-13 LAB — HEMOGLOBIN A1C
Est. average glucose Bld gHb Est-mCnc: 146 mg/dL
Hgb A1c MFr Bld: 6.7 % — ABNORMAL HIGH (ref 4.8–5.6)

## 2022-08-13 LAB — HEPATITIS C ANTIBODY: Hep C Virus Ab: NONREACTIVE

## 2022-08-13 LAB — VITAMIN D 25 HYDROXY (VIT D DEFICIENCY, FRACTURES): Vit D, 25-Hydroxy: 11.3 ng/mL — ABNORMAL LOW (ref 30.0–100.0)

## 2022-08-15 ENCOUNTER — Other Ambulatory Visit: Payer: Self-pay | Admitting: Family Medicine

## 2022-08-15 MED ORDER — ROSUVASTATIN CALCIUM 10 MG PO TABS: 10.0000 mg | ORAL_TABLET | Freq: Every day | ORAL | 3 refills | Status: DC

## 2022-08-15 NOTE — Progress Notes (Signed)
Please inform patient,   Hemoglobin A1c 6.7 indicates type 2 diabetes, Plan of treatment will included Metformin 500 mg tablet daily- Medications sent to pharmacy - We will recheck your labs in 3 months It is important to follow a DASH diet which includes vegetables,fruits,whole grains, fat free or low fat diary,fish,poultry,beans,nuts and seeds,vegetable oils. Find an activity that you will enjoy and start to be active at least 5 days a week for 30 minutes each day.   Cholesterol levels elevated, start lifestyle modifications follow diet low in saturated fat, reduce dietary salt intake, avoid fatty foods, maintain an exercise routine 3 to 5 days a week for a minimum total of 150 minutes.  Plan: Crestor 10 mg daily - meds sent to pharmacy    Vitamin D levels low, I advise to taking  over the counter supplements of vitamin D [phone] IU/day to prevent low vitamin D levels. Consuming Vitamin D rich food sources include fish, salmon, sardines, egg yolks, red meat, liver, oranges, soy milk.

## 2022-08-18 LAB — COLOGUARD: COLOGUARD: POSITIVE — AB

## 2022-08-20 ENCOUNTER — Encounter: Payer: Self-pay | Admitting: *Deleted

## 2022-08-20 ENCOUNTER — Other Ambulatory Visit: Payer: Self-pay | Admitting: Family Medicine

## 2022-08-20 DIAGNOSIS — Z1211 Encounter for screening for malignant neoplasm of colon: Secondary | ICD-10-CM

## 2022-08-20 NOTE — Progress Notes (Signed)
Please inform patient,  Positive Cologuard result should be followed with a colonoscopy - visual examination of the colon.  Referral placed to GI

## 2022-08-20 NOTE — Addendum Note (Signed)
Addended by: Rica Records on: 08/20/2022 09:03 AM   Modules accepted: Level of Service

## 2022-08-24 ENCOUNTER — Ambulatory Visit
Admission: RE | Admit: 2022-08-24 | Discharge: 2022-08-24 | Disposition: A | Discharge status: Home / Self Care | Payer: 59 | Source: Ambulatory Visit | Attending: Family Medicine | Admitting: Family Medicine

## 2022-08-25 ENCOUNTER — Other Ambulatory Visit: Payer: Self-pay | Admitting: Family Medicine

## 2022-08-25 DIAGNOSIS — F339 Major depressive disorder, recurrent, unspecified: Secondary | ICD-10-CM

## 2022-08-25 DIAGNOSIS — M25551 Pain in right hip: Secondary | ICD-10-CM

## 2022-08-28 ENCOUNTER — Other Ambulatory Visit: Payer: Self-pay | Admitting: Family Medicine

## 2022-08-28 DIAGNOSIS — R928 Other abnormal and inconclusive findings on diagnostic imaging of breast: Secondary | ICD-10-CM

## 2022-09-14 ENCOUNTER — Encounter: Payer: Self-pay | Admitting: Family Medicine

## 2022-09-14 ENCOUNTER — Ambulatory Visit: Payer: 59 | Admitting: Family Medicine

## 2022-09-14 VITALS — BP 136/88 | HR 71 | Ht 64.0 in | Wt 215.0 lb

## 2022-09-14 DIAGNOSIS — F339 Major depressive disorder, recurrent, unspecified: Secondary | ICD-10-CM

## 2022-09-14 NOTE — Assessment & Plan Note (Addendum)
Flowsheet Row Office Visit from 09/14/2022 in [facility]  PHQ-9 Total Score 5     Patient reported Wellbutrin 150 mg daily is helping and she's feeling much better. Patient wants to continue Wellbutrin 150 mg daily Continued discussion on lifestyle changes, establishing a daily routine, going outdoors, exercise, healthy eating habits, mindfulness and mediatation.

## 2022-09-14 NOTE — Patient Instructions (Signed)
        Great to see you today.   - Please take medications as prescribed. - Follow up with your primary health provider if any health concerns arises. - If symptoms worsen please contact your primary care provider and/or visit the emergency department.  

## 2022-09-14 NOTE — Progress Notes (Signed)
Patient Office Visit   Subjective   Patient ID: Ashley Serrano, female    DOB: 12-10-1955  Age: 67 y.o. MRN: [id number]  CC:  Chief Complaint  Patient presents with   Depression    Patient is here for depression f/u. States she feels better since starting meds.     HPI Ashley Serrano 67 year old female, presents to the clinic for depression follow up. She  has a past medical history of Multiple gastric ulcers. For the details of today's visit, please refer to assessment and plan.   HPI    Outpatient Encounter Medications as of 09/14/2022  Medication Sig   ALPRAZolam (XANAX) 0.25 MG tablet Take 0.25 mg by mouth at bedtime as needed for anxiety.   buPROPion (WELLBUTRIN XL) 150 MG 24 hr tablet TAKE 1 TABLET BY MOUTH EVERY DAY   cetirizine (ZYRTEC) 10 MG tablet Take 1 tablet (10 mg total) by mouth daily.   DULoxetine (CYMBALTA) 30 MG capsule TAKE 1 CAPSULE BY MOUTH EVERY DAY   ibuprofen (ADVIL,MOTRIN) 200 MG tablet Take 200 mg by mouth 2 (two) times daily.   metFORMIN (GLUCOPHAGE) 500 MG tablet Take 1 tablet (500 mg total) by mouth daily. Please take daily with meals   rosuvastatin (CRESTOR) 10 MG tablet Take 1 tablet (10 mg total) by mouth daily.   No facility-administered encounter medications on file as of 09/14/2022.    No past surgical history on file.  Review of Systems  Constitutional:  Negative for chills and fever.  HENT:  Negative for hearing loss.   Eyes:  Negative for blurred vision.  Respiratory:  Negative for shortness of breath.   Cardiovascular:  Negative for chest pain.  Gastrointestinal:  Negative for heartburn.  Genitourinary:  Negative for dysuria.  Musculoskeletal:  Negative for myalgias.  Skin:  Negative for rash.  Neurological:  Negative for dizziness and headaches.  Psychiatric/Behavioral:  Positive for depression. The patient is not nervous/anxious and does not have insomnia.       Objective    BP 136/88   Pulse 71   Ht 5\' 4"  (1.626 m)    Wt 215 lb (97.5 kg)   SpO2 97%   BMI 36.90 kg/m   Physical Exam Vitals reviewed.  Constitutional:      General: She is not in acute distress.    Appearance: Normal appearance. She is not ill-appearing, toxic-appearing or diaphoretic.  HENT:     Head: Normocephalic.  Eyes:     General:        Right eye: No discharge.        Left eye: No discharge.     Conjunctiva/sclera: Conjunctivae normal.  Cardiovascular:     Rate and Rhythm: Normal rate.     Pulses: Normal pulses.     Heart sounds: Normal heart sounds.  Pulmonary:     Effort: Pulmonary effort is normal. No respiratory distress.     Breath sounds: Normal breath sounds.  Musculoskeletal:        General: Normal range of motion.     Cervical back: Normal range of motion.  Skin:    General: Skin is warm and dry.     Capillary Refill: Capillary refill takes less than 2 seconds.  Neurological:     General: No focal deficit present.     Mental Status: She is alert and oriented to person, place, and time.     Coordination: Coordination normal.     Gait: Gait normal.  Psychiatric:  Mood and Affect: Mood normal.        Behavior: Behavior normal.       Assessment & Plan:  Depression, recurrent [facility]) Assessment & Plan: Flowsheet Row Office Visit from 09/14/2022 in [facility]  PHQ-9 Total Score 5     Patient reported Wellbutrin 150 mg daily is helping and she's feeling much better. Patient wants to continue Wellbutrin 150 mg daily Continued discussion on lifestyle changes, establishing a daily routine, going outdoors, exercise, healthy eating habits, mindfulness and mediatation.      Return in about 3 months (around 12/15/2022) for chronic follow-up, routine labs.   Cruzita Lederer Newman Nip, FNP

## 2022-10-26 ENCOUNTER — Ambulatory Visit
Admission: RE | Admit: 2022-10-26 | Discharge: 2022-10-26 | Disposition: A | Discharge status: Home / Self Care | Payer: 59 | Source: Ambulatory Visit | Attending: Family Medicine | Admitting: Family Medicine

## 2022-10-26 DIAGNOSIS — R928 Other abnormal and inconclusive findings on diagnostic imaging of breast: Secondary | ICD-10-CM

## 2022-11-10 ENCOUNTER — Other Ambulatory Visit: Payer: Self-pay | Admitting: Family Medicine

## 2022-11-12 NOTE — Telephone Encounter (Signed)
Urgent Care Patient Requested Prescriptions  Pending Prescriptions Disp Refills   triamcinolone cream (KENALOG) 0.1 % [Pharmacy Med Name: TRIAMCINOLONE 0.1% CREAM] 30 g 0    Sig: APPLY TO AFFECTED AREA TWICE A DAY     There is no refill protocol information for this order

## 2022-12-14 ENCOUNTER — Ambulatory Visit: Payer: 59 | Admitting: Family Medicine

## 2022-12-14 VITALS — BP 138/75 | HR 73 | Ht 64.0 in | Wt 208.1 lb

## 2022-12-14 DIAGNOSIS — Z114 Encounter for screening for human immunodeficiency virus [HIV]: Secondary | ICD-10-CM

## 2022-12-14 DIAGNOSIS — M25569 Pain in unspecified knee: Secondary | ICD-10-CM | POA: Insufficient documentation

## 2022-12-14 DIAGNOSIS — R195 Other fecal abnormalities: Secondary | ICD-10-CM

## 2022-12-14 DIAGNOSIS — M25562 Pain in left knee: Secondary | ICD-10-CM

## 2022-12-14 DIAGNOSIS — G8929 Other chronic pain: Secondary | ICD-10-CM

## 2022-12-14 DIAGNOSIS — E782 Mixed hyperlipidemia: Secondary | ICD-10-CM

## 2022-12-14 DIAGNOSIS — M25561 Pain in right knee: Secondary | ICD-10-CM

## 2022-12-14 DIAGNOSIS — E119 Type 2 diabetes mellitus without complications: Secondary | ICD-10-CM | POA: Insufficient documentation

## 2022-12-14 DIAGNOSIS — Z7984 Long term (current) use of oral hypoglycemic drugs: Secondary | ICD-10-CM | POA: Diagnosis not present

## 2022-12-14 DIAGNOSIS — F339 Major depressive disorder, recurrent, unspecified: Secondary | ICD-10-CM

## 2022-12-14 MED ORDER — MELOXICAM 7.5 MG PO TABS: 7.5000 mg | ORAL_TABLET | Freq: Once | ORAL | 1 refills | Status: DC | PRN

## 2022-12-14 MED ORDER — BUPROPION HCL ER (XL) 300 MG PO TB24: 300.0000 mg | ORAL_TABLET | Freq: Every day | ORAL | 3 refills | Status: DC

## 2022-12-14 NOTE — Patient Instructions (Signed)

## 2022-12-14 NOTE — Assessment & Plan Note (Signed)
Left knee slight swelling with tenderness Trial on Mobic 7.5 mg PRN Explained to patient Non pharmacological interventions include the use of ice or heat, rest, recommend range of motion exercises, gentle stretching. The use of Tylenol for pain management.  Follow up for worsening or persistent symptoms. Patient verbalizes understanding regarding plan of care and all questions answered.

## 2022-12-14 NOTE — Assessment & Plan Note (Signed)
Flowsheet Row Office Visit from 12/14/2022 in [facility]  PHQ-9 Total Score 11     Patient tolerating Wellbutrin 150 mg  daily depression Patient reports depression symptoms has return but is managing sometimes, reports care giver stain and work stress on a daily basis  Increase Wellbutrin 300 mg daily Continued discussion on lifestyle changes, establishing a daily routine, going outdoors, exercise, healthy eating habits, mindfulness and mediatation.

## 2022-12-14 NOTE — Progress Notes (Signed)
Patient Office Visit   Subjective   Patient ID: Ashley Serrano, female    DOB: 1955-10-15  Age: 67 y.o. MRN: [id number]  CC:  Chief Complaint  Patient presents with   Depression    F/u   Labs Only    F/u   Medication Refill    Pt. Would like to change dosage on arthritis medication.    Knee Pain    Rt. Knee pain x 3 months    HPI Ashley Serrano 67 year old female, presents to the clinic for chronic follow up. She  has a past medical history of Multiple gastric ulcers.  Knee Pain  There was no injury mechanism. The pain is present in the left knee and right knee. The quality of the pain is described as aching, stiffness,tight The pain is at a severity of 8/10. The pain has been Fluctuating since onset. Associated symptoms include an inability to bear weight. She reports no foreign bodies present. The symptoms are aggravated by palpation and weight bearing. She has tried acetaminophen for the symptoms. The treatment provided moderate relief.        Outpatient Encounter Medications as of 12/14/2022  Medication Sig   buPROPion (WELLBUTRIN XL) 300 MG 24 hr tablet Take 1 tablet (300 mg total) by mouth daily.   cetirizine (ZYRTEC) 10 MG tablet Take 1 tablet (10 mg total) by mouth daily.   ibuprofen (ADVIL,MOTRIN) 200 MG tablet Take 200 mg by mouth 2 (two) times daily.   meloxicam (MOBIC) 7.5 MG tablet Take 1 tablet (7.5 mg total) by mouth once as needed for up to 1 dose for pain.   metFORMIN (GLUCOPHAGE) 500 MG tablet Take 1 tablet (500 mg total) by mouth daily. Please take daily with meals   [DISCONTINUED] buPROPion (WELLBUTRIN XL) 150 MG 24 hr tablet TAKE 1 TABLET BY MOUTH EVERY DAY   ALPRAZolam (XANAX) 0.25 MG tablet Take 0.25 mg by mouth at bedtime as needed for anxiety. (Patient not taking: Reported on 12/14/2022)   DULoxetine (CYMBALTA) 30 MG capsule TAKE 1 CAPSULE BY MOUTH EVERY DAY (Patient not taking: Reported on 12/14/2022)   rosuvastatin (CRESTOR) 10 MG tablet Take 1  tablet (10 mg total) by mouth daily. (Patient not taking: Reported on 12/14/2022)   No facility-administered encounter medications on file as of 12/14/2022.    No past surgical history on file.  Review of Systems  Constitutional:  Negative for chills and fever.  Eyes:  Negative for blurred vision.  Respiratory:  Negative for shortness of breath.   Cardiovascular:  Negative for chest pain.  Gastrointestinal:  Negative for abdominal pain.  Genitourinary:  Negative for dysuria.  Musculoskeletal:  Positive for joint pain and myalgias.  Neurological:  Negative for headaches.      Objective    BP 138/75 (BP Location: Right Arm, Patient Position: Sitting, Cuff Size: Normal)   Pulse 73   Ht 5\' 4"  (1.626 m)   Wt 208 lb 1.9 oz (94.4 kg)   SpO2 94%   BMI 35.72 kg/m   Physical Exam Vitals reviewed.  Constitutional:      General: She is not in acute distress.    Appearance: Normal appearance. She is not ill-appearing, toxic-appearing or diaphoretic.  HENT:     Head: Normocephalic.  Eyes:     General:        Right eye: No discharge.        Left eye: No discharge.     Conjunctiva/sclera: Conjunctivae normal.  Cardiovascular:     Rate and Rhythm: Normal rate.     Pulses: Normal pulses.     Heart sounds: Normal heart sounds.  Pulmonary:     Effort: Pulmonary effort is normal. No respiratory distress.     Breath sounds: Normal breath sounds.  Musculoskeletal:     Cervical back: Normal range of motion.     Right knee: Normal.     Left knee: Swelling present. Tenderness present.  Skin:    General: Skin is warm and dry.  Neurological:     General: No focal deficit present.     Mental Status: She is alert and oriented to person, place, and time.     Coordination: Coordination normal.     Gait: Gait normal.  Psychiatric:        Mood and Affect: Mood normal.       Assessment & Plan:  Mixed hyperlipidemia -     BMP8+eGFR -     Lipid panel -     Microalbumin / creatinine urine  ratio  Type 2 diabetes mellitus without complication, without long-term current use of insulin (HCC) Assessment & Plan: Last Hemoglobin A1C 6.7 Labs ordered today awaiting results will follow up. Patient reports taking  Metformin 500 mg daily. Discussed Non pharmacological interventions such as low carb diet,high in protein, vegetables and fruit discussed. Educated on importance of physical activity 150 minutes per week. Discussed signs and symptoms of hypoglycemia, & hyperglycemia and need to present to the ED if symptoms occurs.Follow up in 3 months or sooner if needed. Patient verbalizes understanding regarding plan of care and all questions answered. Ophthalmology referral placed , Foot exam within desired limits   Orders: -     Hemoglobin A1c  Screening for HIV (human immunodeficiency virus) -     HIV Antibody (routine testing w rflx)  Diabetic eye exam (HCC) -     Ambulatory referral to Ophthalmology  Positive colorectal cancer screening using Cologuard test -     Ambulatory referral to Gastroenterology  Chronic pain of both knees Assessment & Plan: Left knee slight swelling with tenderness Trial on Mobic 7.5 mg PRN Explained to patient Non pharmacological interventions include the use of ice or heat, rest, recommend range of motion exercises, gentle stretching. The use of Tylenol for pain management.  Follow up for worsening or persistent symptoms. Patient verbalizes understanding regarding plan of care and all questions answered.    Depression, recurrent [facility]) Assessment & Plan: Flowsheet Row Office Visit from 12/14/2022 in [facility]  PHQ-9 Total Score 11     Patient tolerating Wellbutrin 150 mg  daily depression Patient reports depression symptoms has return but is managing sometimes, reports care giver stain and work stress on a daily basis  Increase Wellbutrin 300 mg daily Continued discussion on lifestyle changes, establishing a daily routine,  going outdoors, exercise, healthy eating habits, mindfulness and mediatation.     Other orders -     buPROPion HCl ER (XL); Take 1 tablet (300 mg total) by mouth daily.  Dispense: 30 tablet; Refill: 3 -     Meloxicam; Take 1 tablet (7.5 mg total) by mouth once as needed for up to 1 dose for pain.  Dispense: 30 tablet; Refill: 1    Return in about 4 months (around 04/15/2023), or if symptoms worsen or fail to improve, for hyperlipidemia, type 2 diabetes.   Cruzita Lederer Newman Nip, FNP

## 2022-12-14 NOTE — Assessment & Plan Note (Signed)
Last Hemoglobin A1C 6.7 Labs ordered today awaiting results will follow up. Patient reports taking  Metformin 500 mg daily. Discussed Non pharmacological interventions such as low carb diet,high in protein, vegetables and fruit discussed. Educated on importance of physical activity 150 minutes per week. Discussed signs and symptoms of hypoglycemia, & hyperglycemia and need to present to the ED if symptoms occurs.Follow up in 3 months or sooner if needed. Patient verbalizes understanding regarding plan of care and all questions answered. Ophthalmology referral placed , Foot exam within desired limits

## 2022-12-17 LAB — LIPID PANEL
Chol/HDL Ratio: 2.9 ratio (ref 0.0–4.4)
Cholesterol, Total: 151 mg/dL (ref 100–199)
HDL: 52 mg/dL (ref >39)
LDL Chol Calc (NIH): 69 mg/dL (ref 0–99)
Triglycerides: 178 mg/dL — ABNORMAL HIGH (ref 0–149)
VLDL Cholesterol Cal: 30 mg/dL (ref 5–40)

## 2022-12-17 LAB — HIV ANTIBODY (ROUTINE TESTING W REFLEX): HIV Screen 4th Generation wRfx: NONREACTIVE

## 2022-12-17 LAB — MICROALBUMIN / CREATININE URINE RATIO
Creatinine, Urine: 122.1 mg/dL
Microalb/Creat Ratio: 9 mg/g{creat} (ref 0–29)
Microalbumin, Urine: 10.6 ug/mL

## 2022-12-17 LAB — BMP8+EGFR
BUN/Creatinine Ratio: 23 (ref 12–28)
BUN: 20 mg/dL (ref 8–27)
CO2: 24 mmol/L (ref 20–29)
Calcium: 10.2 mg/dL (ref 8.7–10.3)
Chloride: 98 mmol/L (ref 96–106)
Creatinine, Ser: 0.88 mg/dL (ref 0.57–1.00)
Glucose: 94 mg/dL (ref 70–99)
Potassium: 4.9 mmol/L (ref 3.5–5.2)
Sodium: 140 mmol/L (ref 134–144)
eGFR: 73 mL/min/{1.73_m2} (ref >59)

## 2022-12-17 LAB — HEMOGLOBIN A1C
Est. average glucose Bld gHb Est-mCnc: 137 mg/dL
Hgb A1c MFr Bld: 6.4 % — ABNORMAL HIGH (ref 4.8–5.6)

## 2022-12-27 ENCOUNTER — Encounter (INDEPENDENT_AMBULATORY_CARE_PROVIDER_SITE_OTHER): Payer: Self-pay | Admitting: Gastroenterology

## 2022-12-27 ENCOUNTER — Telehealth (INDEPENDENT_AMBULATORY_CARE_PROVIDER_SITE_OTHER): Payer: Self-pay | Admitting: Gastroenterology

## 2022-12-27 ENCOUNTER — Ambulatory Visit (INDEPENDENT_AMBULATORY_CARE_PROVIDER_SITE_OTHER): Payer: 59 | Admitting: Gastroenterology

## 2022-12-27 ENCOUNTER — Ambulatory Visit (INDEPENDENT_AMBULATORY_CARE_PROVIDER_SITE_OTHER): Payer: 59

## 2022-12-27 VITALS — BP 169/79 | HR 63 | Temp 97.5°F | Ht 64.0 in | Wt 210.9 lb

## 2022-12-27 DIAGNOSIS — R0602 Shortness of breath: Secondary | ICD-10-CM | POA: Insufficient documentation

## 2022-12-27 DIAGNOSIS — R011 Cardiac murmur, unspecified: Secondary | ICD-10-CM

## 2022-12-27 DIAGNOSIS — Z794 Long term (current) use of insulin: Secondary | ICD-10-CM | POA: Diagnosis not present

## 2022-12-27 DIAGNOSIS — R195 Other fecal abnormalities: Secondary | ICD-10-CM

## 2022-12-27 DIAGNOSIS — E119 Type 2 diabetes mellitus without complications: Secondary | ICD-10-CM

## 2022-12-27 NOTE — Progress Notes (Signed)
Katrinka Blazing, M.D. Gastroenterology & Hepatology [facility] Gastroenter[facility] [address] [place], [place] [address] Primary Care Physician: Rica Records, [place] [address] [place] [address]  Referring MD: PCP  Chief Complaint: Positive Cologuard  History of Present Illness: Ashley Serrano is a 67 y.o. female with past medical history of depression, anxiety, DM, gastric ulcers, who presents for evaluation of positive Cologuard.  The patient denies having any nausea, vomiting, fever, chills, hematochezia, melena, hematemesis, abdominal distention, abdominal pain, diarrhea, jaundice, pruritus. Patient has lost 30 lb by making dietary changes.  Notably, the patient reports she may have some mild SOB when she walks a block, but denies any chest apin, lightheadedness or dizziness.  Patient had a positive Cologuard on 08/10/2022.  Most recent CBC on 08/06/2022 showed a normal hemoglobin of 13.5 with MCV of 83.  Last EGD:15 years ago, patient reports it was normal. This was performed at [facility] but no reports are available. Last Colonoscopy:15 years ago, patient reports that she had polyps removed but they "were benign". This was performed at [facility] but no reports are available.  FHx: neg for any gastrointestinal/liver disease, mother esophageal cancer, brother bladder cancer, sister had some cancer  - unknown primary Social: smokes close to a pack a day, neg  alcohol, smoked marijuana many years ago. Surgical: cholecystectomy, ventral hernia repair x3, appendectomy  Past Medical History: Past Medical History:  Diagnosis Date   Multiple gastric ulcers     Past Surgical History:History reviewed. No pertinent surgical history.  Family History: Family History  Problem Relation Age of Onset   Cancer Mother    Thyroid disease Mother    Hypertension Father    Alcoholism Sister    Seizures Sister    Arthritis Sister     Cancer Brother    Arthritis Brother    Ulcers Maternal Grandmother     Social History: Social History   Tobacco Use  Smoking Status Every Day   Current packs/day: 1.00   Types: Cigarettes  Smokeless Tobacco Never   Social History   Substance and Sexual Activity  Alcohol Use None   Social History   Substance and Sexual Activity  Drug Use Never    Allergies: Allergies  Allergen Reactions   Wool Alcohol [Lanolin] Hives    Causing swelling and rash    Medications: Current Outpatient Medications  Medication Sig Dispense Refill   acetaminophen (TYLENOL) 650 MG CR tablet Take 650 mg by mouth every 8 (eight) hours as needed for pain.     buPROPion (WELLBUTRIN XL) 300 MG 24 hr tablet Take 1 tablet (300 mg total) by mouth daily. 30 tablet 3   cetirizine (ZYRTEC) 10 MG tablet Take 1 tablet (10 mg total) by mouth daily. 30 tablet 0   DULoxetine (CYMBALTA) 30 MG capsule TAKE 1 CAPSULE BY MOUTH EVERY DAY 90 capsule 2   meloxicam (MOBIC) 7.5 MG tablet Take 1 tablet (7.5 mg total) by mouth once as needed for up to 1 dose for pain. 30 tablet 1   metFORMIN (GLUCOPHAGE) 500 MG tablet Take 1 tablet (500 mg total) by mouth daily. Please take daily with meals 180 tablet 3   OVER THE COUNTER MEDICATION Vit D 25 mcg daily.     rosuvastatin (CRESTOR) 10 MG tablet Take 1 tablet (10 mg total) by mouth daily. 90 tablet 3   No current facility-administered medications for this visit.    Review of Systems: GENERAL:  negative for malaise, night sweats HEENT: No changes in hearing or vision, no nose bleeds or other nasal problems. NECK: Negative for lumps, goiter, pain and significant neck swelling RESPIRATORY: Negative for cough, wheezing CARDIOVASCULAR: Negative for chest pain, leg swelling, palpitations, orthopnea GI: SEE HPI MUSCULOSKELETAL: Negative for joint pain or swelling, back pain, and muscle pain. SKIN: Negative for lesions, rash PSYCH: Negative for sleep disturbance, mood disorder  and recent psychosocial stressors. HEMATOLOGY Negative for prolonged bleeding, bruising easily, and swollen nodes. ENDOCRINE: Negative for cold or heat intolerance, polyuria, polydipsia and goiter. NEURO: negative for tremor, gait imbalance, syncope and seizures. The remainder of the review of systems is noncontributory.   Physical Exam: BP (!) 169/79 (BP Location: Left Arm, Patient Position: Sitting, Cuff Size: Large)   Pulse 63   Temp (!) 97.5 F (36.4 C) (Temporal)   Ht 5\' 4"  (1.626 m)   Wt 210 lb 14.4 oz (95.7 kg)   BMI 36.20 kg/m  GENERAL: The patient is AO x3, in no acute distress. HEENT: Head is normocephalic and atraumatic. EOMI are intact. Mouth is well hydrated and without lesions. NECK: Supple. No masses LUNGS: Clear to auscultation. No presence of rhonchi/wheezing/rales. Adequate chest expansion HEART: RRR, normal s1 and s2. There is presence of a pansystolic murmur. ABDOMEN: Soft, nontender, no guarding, no peritoneal signs, and nondistended. BS +. No masses. EXTREMITIES: Without any cyanosis, clubbing, rash, lesions or edema. NEUROLOGIC: AOx3, no focal motor deficit. SKIN: no jaundice, no rashes   Imaging/Labs: as above  I personally reviewed and interpreted the available labs, imaging and endoscopic files.  Impression and Plan: Ashley Serrano is a 67 y.o. female with past medical history of depression, anxiety, DM, gastric ulcers, who presents for evaluation of positive Cologuard. Patient does not have any high risk factors for colorectal cancer malignancy and has been asymptomatic. Discussed cologuard test results in detail, specifically what it means when the test is positive or negative.  Discussed that there is a possibility that even when the test is positive there may not be a polyp found on colonoscopy. More than 50% of the office visit was dedicated to discussing the procedure, including the day of and risks involved. Patient understands what the procedure  involves including the benefits and any risks. Patient understands alternatives to the proposed procedure. Risks including (but not limited to) bleeding, tearing of the lining (perforation), rupture of adjacent organs, problems with heart and lung function, infection, and medication reactions. A small percentage of complications may require surgery, hospitalization, repeat endoscopic procedure, and/or transfusion. A small percentage of polyps and other tumors may not be seen.  Importantly, the patient was found to have a new systolic murmur on auscultation.  She also endorses having some shortness of breath with exertion which is concerning.  I discussed with the patient the importance of proceeding with TTE first to determine if she has any significant valvulopathy that may warrant an initial evaluation with cardiology versus scheduling her colonoscopy if no severe aortic/mitral valve disease.  I also explained to her that regardless if we proceed with a colonoscopy, we we will eventually refer her to cardiology to evaluate her shortness of breath further.  - Perform transthoracic echocardiogram - Depending on findings of echocardiogram, we may proceed with colonoscopy or may need to have further evaluation by cardiology first  All questions were answered.      Katrinka Blazing, MD Gastroenterology and Hepatology [facility] Gastroenterology

## 2022-12-27 NOTE — Telephone Encounter (Signed)
Pt left voicemail returning call. Returned cal to patient. Pt verbalized understanding. Pt states she is going to retire on 01/24/23 and will have new insurance then. Advised pt that since she still has [employer] active, we went ahead with [facility] insurance. Advised pt that if she cancels or reschedules Echo and has new insurance I would need new insurance card and would need to re do PA. Pt verbalized understanding.

## 2022-12-27 NOTE — Telephone Encounter (Signed)
PA approved via [facility] for Echocardiogram. Expires 02/10/23. Contacted [facility] and scheduled pt for 01/24/23 at 8:30am; pt will need to arrive at 8am over at [facility]. Left message to return call.

## 2022-12-27 NOTE — Progress Notes (Signed)
Ashley Serrano arrived 12/27/2022 and has given verbal consent to obtain images and complete their overdue diabetic retinal screening.  The images have been sent to an ophthalmologist or optometrist for review and interpretation.  Results will be sent back to Del Newman Nip, [place], FNP for review.  Patient has been informed they will be contacted when we receive the results via telephone or MyChart

## 2022-12-27 NOTE — Patient Instructions (Signed)
Perform transthoracic echocardiogram Depending on findings of echocardiogram, we may proceed with colonoscopy or may need to have further evaluation by cardiology first

## 2023-01-07 ENCOUNTER — Other Ambulatory Visit: Payer: Self-pay | Admitting: Family Medicine

## 2023-01-23 ENCOUNTER — Telehealth (INDEPENDENT_AMBULATORY_CARE_PROVIDER_SITE_OTHER): Payer: Self-pay | Admitting: Gastroenterology

## 2023-01-23 NOTE — Telephone Encounter (Signed)
Echo lab called in and is wanting to know if an Echo buble study is neccessary for murmur/shortness of breath. Pt has appt with Echo tomorrow.

## 2023-01-23 NOTE — Telephone Encounter (Signed)
Yes, please, with bubbles

## 2023-01-24 ENCOUNTER — Ambulatory Visit (HOSPITAL_COMMUNITY)
Admission: RE | Admit: 2023-01-24 | Discharge: 2023-01-24 | Disposition: A | Discharge status: Home / Self Care | Payer: 59 | Source: Ambulatory Visit | Attending: Gastroenterology | Admitting: Gastroenterology

## 2023-01-24 DIAGNOSIS — R011 Cardiac murmur, unspecified: Secondary | ICD-10-CM | POA: Insufficient documentation

## 2023-01-24 LAB — ECHOCARDIOGRAM COMPLETE BUBBLE STUDY
AR max vel: 1.31 cm2
AV Area VTI: 1.35 cm2
AV Area mean vel: 1.3 cm2
AV Mean grad: 15 mm[Hg]
AV Peak grad: 28.5 mm[Hg]
Ao pk vel: 2.67 m/s
Area-P 1/2: 2.66 cm2
S' Lateral: 3.1 cm

## 2023-01-24 NOTE — Progress Notes (Signed)
*  PRELIMINARY RESULTS* Echocardiogram 2D Echocardiogram has been performed with saline bubble study.  Stacey Drain 01/24/2023, 9:23 AM

## 2023-01-31 ENCOUNTER — Other Ambulatory Visit (INDEPENDENT_AMBULATORY_CARE_PROVIDER_SITE_OTHER): Payer: Self-pay | Admitting: Gastroenterology

## 2023-01-31 MED ORDER — SUTAB 1479-225-188 MG PO TABS: ORAL_TABLET | ORAL | 0 refills | Status: DC

## 2023-02-08 ENCOUNTER — Other Ambulatory Visit: Payer: Self-pay

## 2023-02-08 ENCOUNTER — Other Ambulatory Visit: Payer: Self-pay | Admitting: Family Medicine

## 2023-02-20 ENCOUNTER — Other Ambulatory Visit: Payer: Self-pay | Admitting: Family Medicine

## 2023-02-20 DIAGNOSIS — M25551 Pain in right hip: Secondary | ICD-10-CM

## 2023-02-26 ENCOUNTER — Ambulatory Visit (HOSPITAL_COMMUNITY)
Admission: RE | Admit: 2023-02-26 | Discharge: 2023-02-26 | Disposition: A | Discharge status: Home / Self Care | Payer: Medicare PPO | Attending: Gastroenterology | Admitting: Gastroenterology

## 2023-02-26 ENCOUNTER — Encounter (HOSPITAL_COMMUNITY)
Admission: RE | Disposition: A | Discharge status: Home / Self Care | Payer: Self-pay | Source: Home / Self Care | Attending: Gastroenterology

## 2023-02-26 ENCOUNTER — Encounter (HOSPITAL_COMMUNITY): Payer: Self-pay | Admitting: Gastroenterology

## 2023-02-26 ENCOUNTER — Other Ambulatory Visit: Payer: Self-pay

## 2023-02-26 ENCOUNTER — Ambulatory Visit (HOSPITAL_COMMUNITY): Payer: Medicare PPO | Admitting: Anesthesiology

## 2023-02-26 DIAGNOSIS — D122 Benign neoplasm of ascending colon: Secondary | ICD-10-CM | POA: Diagnosis not present

## 2023-02-26 DIAGNOSIS — D123 Benign neoplasm of transverse colon: Secondary | ICD-10-CM | POA: Insufficient documentation

## 2023-02-26 DIAGNOSIS — K635 Polyp of colon: Secondary | ICD-10-CM

## 2023-02-26 DIAGNOSIS — K648 Other hemorrhoids: Secondary | ICD-10-CM | POA: Insufficient documentation

## 2023-02-26 DIAGNOSIS — R195 Other fecal abnormalities: Secondary | ICD-10-CM | POA: Insufficient documentation

## 2023-02-26 DIAGNOSIS — Z1211 Encounter for screening for malignant neoplasm of colon: Secondary | ICD-10-CM | POA: Diagnosis not present

## 2023-02-26 DIAGNOSIS — D126 Benign neoplasm of colon, unspecified: Secondary | ICD-10-CM | POA: Diagnosis not present

## 2023-02-26 DIAGNOSIS — D120 Benign neoplasm of cecum: Secondary | ICD-10-CM | POA: Diagnosis not present

## 2023-02-26 DIAGNOSIS — K5730 Diverticulosis of large intestine without perforation or abscess without bleeding: Secondary | ICD-10-CM | POA: Diagnosis not present

## 2023-02-26 DIAGNOSIS — E119 Type 2 diabetes mellitus without complications: Secondary | ICD-10-CM | POA: Diagnosis not present

## 2023-02-26 DIAGNOSIS — F17200 Nicotine dependence, unspecified, uncomplicated: Secondary | ICD-10-CM | POA: Insufficient documentation

## 2023-02-26 DIAGNOSIS — D175 Benign lipomatous neoplasm of intra-abdominal organs: Secondary | ICD-10-CM | POA: Insufficient documentation

## 2023-02-26 DIAGNOSIS — D125 Benign neoplasm of sigmoid colon: Secondary | ICD-10-CM | POA: Insufficient documentation

## 2023-02-26 HISTORY — DX: Depression, unspecified: F32.A

## 2023-02-26 HISTORY — DX: Type 2 diabetes mellitus without complications: E11.9

## 2023-02-26 HISTORY — PX: POLYPECTOMY: SHX5525

## 2023-02-26 HISTORY — DX: Unspecified osteoarthritis, unspecified site: M19.90

## 2023-02-26 HISTORY — PX: COLONOSCOPY WITH PROPOFOL: SHX5780

## 2023-02-26 HISTORY — DX: Other seasonal allergic rhinitis: J30.2

## 2023-02-26 LAB — HM COLONOSCOPY

## 2023-02-26 LAB — GLUCOSE, CAPILLARY
Glucose-Capillary: 71 mg/dL (ref 70–99)
Glucose-Capillary: 75 mg/dL (ref 70–99)

## 2023-02-26 SURGERY — COLONOSCOPY WITH PROPOFOL
Anesthesia: General

## 2023-02-26 MED ORDER — LIDOCAINE HCL (CARDIAC) PF 100 MG/5ML IV SOSY
PREFILLED_SYRINGE | INTRAVENOUS | Status: DC | PRN
  Administered 2023-02-26: 60 mg via INTRAVENOUS

## 2023-02-26 MED ORDER — LIDOCAINE HCL (PF) 2 % IJ SOLN
INTRAMUSCULAR | Status: AC
  Filled 2023-02-26: qty 5

## 2023-02-26 MED ORDER — EPINEPHRINE 1 MG/10ML IJ SOSY
PREFILLED_SYRINGE | INTRAMUSCULAR | Status: AC
  Filled 2023-02-26: qty 10

## 2023-02-26 MED ORDER — PROPOFOL 500 MG/50ML IV EMUL
INTRAVENOUS | Status: DC | PRN
  Administered 2023-02-26: 150 ug/kg/min via INTRAVENOUS

## 2023-02-26 MED ORDER — LACTATED RINGERS IV SOLN: INTRAVENOUS | Status: DC | PRN

## 2023-02-26 MED ORDER — PROPOFOL 500 MG/50ML IV EMUL
INTRAVENOUS | Status: AC
  Filled 2023-02-26: qty 50

## 2023-02-26 MED ORDER — SODIUM CHLORIDE (PF) 0.9 % IJ SOLN
PREFILLED_SYRINGE | INTRAMUSCULAR | Status: DC | PRN
  Administered 2023-02-26: 5 mL

## 2023-02-26 MED ORDER — PROPOFOL 10 MG/ML IV BOLUS
INTRAVENOUS | Status: DC | PRN
  Administered 2023-02-26: 80 mg via INTRAVENOUS
  Administered 2023-02-26: 30 mg via INTRAVENOUS

## 2023-02-26 NOTE — Transfer of Care (Addendum)
Immediate Anesthesia Transfer of Care Note  Patient: Ashley Serrano  Procedure(s) Performed: COLONOSCOPY WITH PROPOFOL POLYPECTOMY  Patient Location: Endoscopy Unit  Anesthesia Type:General  Level of Consciousness: drowsy and patient cooperative  Airway & Oxygen Therapy: Patient Spontanous Breathing and Patient connected to nasal cannula oxygen  Post-op Assessment: Report given to RN and Post -op Vital signs reviewed and stable  Post vital signs: Reviewed and stable  Last Vitals:  Vitals Value Taken Time  BP 141/66 02/26/23   1147  Temp 36.4 02/26/23   1147  Pulse 64 02/26/23   1147  Resp 14 02/26/23   1147  SpO2 98% 02/26/23   1147    Last Pain:  Vitals:   02/26/23 0939  TempSrc: Oral  PainSc: 0-No pain      Patients Stated Pain Goal: 8 (02/26/23 0939)  Complications: No notable events documented.

## 2023-02-26 NOTE — Anesthesia Postprocedure Evaluation (Signed)
Anesthesia Post Note  Patient: Ashley Serrano  Procedure(s) Performed: COLONOSCOPY WITH PROPOFOL POLYPECTOMY  Patient location during evaluation: PACU Anesthesia Type: General Level of consciousness: awake and alert Pain management: pain level controlled Vital Signs Assessment: post-procedure vital signs reviewed and stable Respiratory status: spontaneous breathing, nonlabored ventilation, respiratory function stable and patient connected to nasal cannula oxygen Cardiovascular status: blood pressure returned to baseline and stable Postop Assessment: no apparent nausea or vomiting Anesthetic complications: no   There were no known notable events for this encounter.   Last Vitals:  Vitals:   02/26/23 0939 02/26/23 1147  BP: (!) 160/68 (!) 141/66  Pulse: 71 64  Resp: 20 14  Temp: 36.7 C (!) 36.4 C  SpO2: 97% 98%    Last Pain:  Vitals:   02/26/23 1147  TempSrc: Oral  PainSc: 0-No pain                 Lenya Sterne L Donnovan Stamour

## 2023-02-26 NOTE — Anesthesia Preprocedure Evaluation (Signed)
Anesthesia Evaluation  Patient identified by MRN, date of birth, ID band Patient awake    Reviewed: Allergy & Precautions, H&P , NPO status , Patient's Chart, lab work & pertinent test results, reviewed documented beta blocker date and time   Airway Mallampati: II  TM Distance: >3 FB Neck ROM: full    Dental no notable dental hx. (+) Dental Advisory Given, Teeth Intact   Pulmonary shortness of breath, Current Smoker and Patient abstained from smoking.   Pulmonary exam normal breath sounds clear to auscultation       Cardiovascular Exercise Tolerance: Good negative cardio ROS Normal cardiovascular exam Rhythm:regular Rate:Normal     Neuro/Psych  PSYCHIATRIC DISORDERS  Depression    negative neurological ROS     GI/Hepatic Neg liver ROS, PUD,,,  Endo/Other  diabetes, Type 2    Renal/GU negative Renal ROS  negative genitourinary   Musculoskeletal   Abdominal   Peds  Hematology negative hematology ROS (+)   Anesthesia Other Findings   Reproductive/Obstetrics negative OB ROS                             Anesthesia Physical Anesthesia Plan  ASA: 3  Anesthesia Plan: General   Post-op Pain Management: Minimal or no pain anticipated   Induction: Intravenous  PONV Risk Score and Plan: Propofol infusion  Airway Management Planned: Nasal Cannula and Natural Airway  Additional Equipment: None  Intra-op Plan:   Post-operative Plan:   Informed Consent: I have reviewed the patients History and Physical, chart, labs and discussed the procedure including the risks, benefits and alternatives for the proposed anesthesia with the patient or authorized representative who has indicated his/her understanding and acceptance.     Dental Advisory Given  Plan Discussed with: CRNA  Anesthesia Plan Comments:         Anesthesia Quick Evaluation

## 2023-02-26 NOTE — Op Note (Signed)
[facility] Patient Name: Ashley Serrano Procedure Date: 02/26/2023 10:41 AM MRN: [id number] Date of Birth: [birth year] Attending MD: Katrinka Blazing , , [id number] CSN: [id number] Age: 67 Admit Type: Outpatient Procedure:                Colonoscopy Indications:              Positive Cologuard test Providers:                Katrinka Blazing, Nena Polio, RN, Zena Amos Referring MD:              Medicines:                Monitored Anesthesia Care Complications:            No immediate complications. Estimated Blood Loss:     Estimated blood loss: none. Procedure:                Pre-Anesthesia Assessment:                           - Prior to the procedure, a History and Physical                            was performed, and patient medications, allergies                            and sensitivities were reviewed. The patient's                            tolerance of previous anesthesia was reviewed.                           - The risks and benefits of the procedure and the                            sedation options and risks were discussed with the                            patient. All questions were answered and informed                            consent was obtained.                           - ASA Grade Assessment: II - A patient with mild                            systemic disease.                           After obtaining informed consent, the colonoscope                            was passed under direct vision. Throughout the                            procedure, the patient's blood pressure, pulse,  and                            oxygen saturations were monitored continuously. The                            [id number]-[id number] ([id number]) scope was introduced through                            the anus and advanced to the the cecum, identified                            by appendiceal orifice and ileocecal valve. The                            colonoscopy was somewhat  difficult due to                            inadequate bowel prep. The patient tolerated the                            procedure well. The quality of the bowel                            preparation was fair. Scope In: 10:54:53 AM Scope Out: 11:38:45 AM Scope Withdrawal Time: 0 hours 31 minutes 36 seconds  Total Procedure Duration: 0 hours 43 minutes 52 seconds  Findings:      The perianal and digital rectal examinations were normal.      Five sessile polyps were found in the transverse colon, ascending colon       and cecum. The polyps were 3 to 8 mm in size. These polyps were removed       with a cold snare. Resection and retrieval were complete.      A 20 to 25 mm polyp was found in the sigmoid colon. The polyp was       pedunculated. Area was successfully injected with 5 mL of a 0.1 mg/mL       solution of epinephrine for prevention of polypectomy bleeding and to       shrink the polyp bulk size. The polyp was removed with a hot snare.       Resection and retrieval were complete. To prevent bleeding after the       polypectomy, three hemostatic clips were successfully placed. Clip       manufacturer: [employer]. There was no bleeding at the end of the       procedure.      There was a medium-sized lipoma, in the transverse colon.      A few medium-mouthed diverticula were found in the descending colon and       transverse colon.      Non-bleeding internal hemorrhoids were found during retroflexion. The       hemorrhoids were small. Impression:               - Preparation of the colon was fair.                           -  Five 3 to 8 mm polyps in the transverse colon, in                            the ascending colon and in the cecum, removed with                            a cold snare. Resected and retrieved.                           - One 20 to 25 mm polyp in the sigmoid colon,                            removed with a hot snare. Resected and retrieved.                             Injected. Clips were placed. Clip manufacturer:                            [employer].                           - Medium-sized lipoma in the transverse colon.                           - Diverticulosis in the descending colon and in the                            transverse colon.                           - Non-bleeding internal hemorrhoids. Moderate Sedation:      Per Anesthesia Care Recommendation:           - Discharge patient to home (ambulatory).                           - Resume previous diet.                           - Await pathology results.                           - Repeat colonoscopy in 1 year for surveillance due                            to polyp size and inadequate prep - will need                            Trilyte for next colonoscopy. Procedure Code(s):        --- Professional ---                           [phone], Colonoscopy, flexible; with removal of  tumor(s), polyp(s), or other lesion(s) by snare                            technique                           [id number], Colonoscopy, flexible; with directed                            submucosal injection(s), any substance Diagnosis Code(s):        --- Professional ---                           K64.8, Other hemorrhoids                           D12.3, Benign neoplasm of transverse colon (hepatic                            flexure or splenic flexure)                           D12.2, Benign neoplasm of ascending colon                           D12.0, Benign neoplasm of cecum                           D12.5, Benign neoplasm of sigmoid colon                           D17.5, Benign lipomatous neoplasm of                            intra-abdominal organs                           R19.5, Other fecal abnormalities                           K57.30, Diverticulosis of large intestine without                            perforation or abscess without bleeding CPT copyright 2022  American Medical Association. All rights reserved. The codes documented in this report are preliminary and upon coder review may  be revised to meet current compliance requirements. Katrinka Blazing, MD Katrinka Blazing,  02/26/2023 11:47:39 AM This report has been signed electronically. Number of Addenda: 0

## 2023-02-26 NOTE — Discharge Instructions (Signed)
You are being discharged to home.  Resume your previous diet.  We are waiting for your pathology results.  Your physician has recommended a repeat colonoscopy in one year for surveillance due to polyp size and inadequate prep - will need Trilyte for next colonoscopy.

## 2023-02-26 NOTE — H&P (Signed)
Ashley Serrano is an 67 y.o. female.   Chief Complaint: positive Cologuard HPI: Ashley Serrano is a 68 y.o. female with past medical history of depression, anxiety, DM, gastric ulcers, who presents for evaluation of positive Cologuard.   The patient denies having any nausea, vomiting, fever, chills, hematochezia, melena, hematemesis, abdominal distention, abdominal pain, diarrhea, jaundice, pruritus or weight loss.  Past Medical History:  Diagnosis Date   Arthritis    Depression    Diabetes mellitus without complication (HCC)    Multiple gastric ulcers    Seasonal allergies     Past Surgical History:  Procedure Laterality Date   ABDOMINAL HYSTERECTOMY     APPENDECTOMY     CHOLECYSTECTOMY     HERNIA REPAIR     KNEE SURGERY Left     Family History  Problem Relation Age of Onset   Cancer Mother    Thyroid disease Mother    Hypertension Father    Alcoholism Sister    Seizures Sister    Arthritis Sister    Cancer Brother    Arthritis Brother    Ulcers Maternal Grandmother    Social History:  reports that she has been smoking cigarettes. She has never used smokeless tobacco. She reports that she does not use drugs. No history on file for alcohol use.  Allergies:  Allergies  Allergen Reactions   Wool Alcohol [Lanolin] Hives    Causing swelling and rash    Medications Prior to Admission  Medication Sig Dispense Refill   acetaminophen (TYLENOL) 650 MG CR tablet Take 650 mg by mouth every 8 (eight) hours as needed for pain.     buPROPion (WELLBUTRIN XL) 300 MG 24 hr tablet TAKE 1 TABLET BY MOUTH EVERY DAY 90 tablet 2   cetirizine (ZYRTEC) 10 MG tablet Take 1 tablet (10 mg total) by mouth daily. 30 tablet 0   DULoxetine (CYMBALTA) 30 MG capsule TAKE 1 CAPSULE BY MOUTH EVERY DAY 90 capsule 2   meloxicam (MOBIC) 7.5 MG tablet TAKE 1 TABLET (7.5 MG TOTAL) BY MOUTH ONCE AS NEEDED FOR UP TO 1 DOSE FOR PAIN. 30 tablet 1   metFORMIN (GLUCOPHAGE) 500 MG tablet Take 1 tablet (500 mg  total) by mouth daily. Please take daily with meals 180 tablet 3   OVER THE COUNTER MEDICATION Vit D 25 mcg daily.     rosuvastatin (CRESTOR) 10 MG tablet Take 1 tablet (10 mg total) by mouth daily. 90 tablet 3   Sodium Sulfate-Mag Sulfate-KCl (SUTAB) [phone] MG TABS As directed 24 tablet 0    Results for orders placed or performed during the hospital encounter of 02/26/23 (from the past 48 hour(s))  Glucose, capillary     Status: None   Collection Time: 02/26/23  9:30 AM  Result Value Ref Range   Glucose-Capillary 71 70 - 99 mg/dL    Comment: Glucose reference range applies only to samples taken after fasting for at least 8 hours.   No results found.  Review of Systems  All other systems reviewed and are negative.   Blood pressure (!) 160/68, pulse 71, temperature 98.1 F (36.7 C), temperature source Oral, resp. rate 20, height 5\' 4"  (1.626 m), weight 97.5 kg, SpO2 97%. Physical Exam  GENERAL: The patient is AO x3, in no acute distress. HEENT: Head is normocephalic and atraumatic. EOMI are intact. Mouth is well hydrated and without lesions. NECK: Supple. No masses LUNGS: Clear to auscultation. No presence of rhonchi/wheezing/rales. Adequate chest expansion HEART: RRR, normal s1  and s2. ABDOMEN: Soft, nontender, no guarding, no peritoneal signs, and nondistended. BS +. No masses. EXTREMITIES: Without any cyanosis, clubbing, rash, lesions or edema. NEUROLOGIC: AOx3, no focal motor deficit. SKIN: no jaundice, no rashes  Assessment/Plan  Ashley Serrano is a 67 y.o. female with past medical history of depression, anxiety, DM, gastric ulcers, who presents for evaluation of positive Cologuard.  Will proceed with colonoscopy.  Dolores Frame, MD 02/26/2023, 9:52 AM

## 2023-02-27 ENCOUNTER — Encounter (INDEPENDENT_AMBULATORY_CARE_PROVIDER_SITE_OTHER): Payer: Self-pay | Admitting: *Deleted

## 2023-02-27 LAB — SURGICAL PATHOLOGY

## 2023-02-28 ENCOUNTER — Encounter (INDEPENDENT_AMBULATORY_CARE_PROVIDER_SITE_OTHER): Payer: Self-pay | Admitting: *Deleted

## 2023-03-04 ENCOUNTER — Encounter (HOSPITAL_COMMUNITY): Payer: Self-pay | Admitting: Gastroenterology

## 2023-04-11 ENCOUNTER — Other Ambulatory Visit: Payer: Self-pay | Admitting: Family Medicine

## 2023-04-30 NOTE — Progress Notes (Signed)
 Established Patient Office Visit   Subjective  Patient ID: Ashley Serrano, female    DOB: 12/29/1955  Age: 68 y.o. MRN: [id number]  Chief Complaint  Patient presents with   Hyperlipidemia    4 month follow up. States she has been taking b12 1000mg  and wants to make sure that is the correct dose     She  has a past medical history of Arthritis, Depression, Diabetes mellitus without complication (HCC), Multiple gastric ulcers, and Seasonal allergies.  HPI Patient presents to the clinic for chronic follow up. For the details of today's visit, please refer to assessment and plan.    Review of Systems  Constitutional:  Negative for chills and fever.  Eyes:  Negative for blurred vision.  Respiratory:  Negative for shortness of breath.   Cardiovascular:  Negative for chest pain.  Neurological:  Negative for dizziness and headaches.      Objective:     BP 138/80   Pulse 74   Resp 16   Ht 5\' 4"  (1.626 m)   Wt 212 lb (96.2 kg)   SpO2 97%   BMI 36.39 kg/m  BP Readings from Last 3 Encounters:  05/01/23 138/80  02/26/23 (!) 141/66  12/27/22 (!) 169/79      Physical Exam Vitals reviewed.  Constitutional:      General: She is not in acute distress.    Appearance: Normal appearance. She is not ill-appearing, toxic-appearing or diaphoretic.  HENT:     Head: Normocephalic.  Eyes:     General:        Right eye: No discharge.        Left eye: No discharge.     Conjunctiva/sclera: Conjunctivae normal.  Cardiovascular:     Rate and Rhythm: Normal rate.     Pulses: Normal pulses.  Pulmonary:     Effort: Pulmonary effort is normal. No respiratory distress.     Breath sounds: Normal breath sounds.  Abdominal:     General: Bowel sounds are normal.     Palpations: Abdomen is soft.     Tenderness: There is no abdominal tenderness. There is no right CVA tenderness, left CVA tenderness or guarding.  Skin:    General: Skin is warm and dry.     Capillary Refill: Capillary  refill takes less than 2 seconds.  Neurological:     Mental Status: She is alert.      No results found for any visits on 05/01/23.  The 10-year ASCVD risk score (Arnett DK, et al., 2019) is: 19%    Assessment & Plan:  Type 2 diabetes mellitus without complication, without long-term current use of insulin (HCC) Assessment & Plan: Last Hemoglobin A1c: 6.4 Labs: Ordered today, results pending; will follow up accordingly. The patient reports adhering to prescribed medications:  Metformin  500 mg daily  Reviewed non-pharmacological interventions, including a balanced diet rich in lean proteins, healthy fats, whole grains, and high-fiber vegetables. Emphasized reducing refined sugars and processed carbohydrates, and incorporating more fruits, leafy greens, and legumes. Education: Patient was educated on recognizing signs and symptoms of both hypoglycemia and hyperglycemia, and advised to seek emergency care if these symptoms occur. Patient Understanding: The patient verbalized understanding of the care plan, and all questions were answered. Additional Care: Ophthalmology referral was placed. Foot exam results were within normal limits.   Orders: -     Hemoglobin A1c -     Ambulatory referral to Ophthalmology  Mixed hyperlipidemia -     BMP8+eGFR -  CBC with Differential/Platelet -     Lipid panel  Vitamin D  deficiency -     VITAMIN D  25 Hydroxy (Vit-D Deficiency, Fractures)  Vitamin B12 deficiency -     Vitamin B12  Screening for osteoporosis -     DG Bone Density; Future    Return in about 6 months (around 10/29/2023), or if symptoms worsen or fail to improve, for chronic follow-up.   Avelino Lek Amber Bail, FNP

## 2023-04-30 NOTE — Patient Instructions (Addendum)
        Great to see you today.  I have refilled the medication(s) we provide.    Vitamin D  levels low, I advise to taking  over the counter supplements of vitamin D  1000 IU/day to prevent low vitamin D  levels.  Consume Vitamin D -Rich Foods: Fatty Fish: Salmon, mackerel, tuna, and sardines. Egg Yolks: A good source if eaten whole. Fortified Foods: Milk, orange juice, cereals, and plant-based milks (like almond or soy milk). Mushrooms: Especially those exposed to sunlight or UV light. Cod Liver Oil: A concentrated source of vitamin D . Including these foods in your diet can help boost vitamin D  levels   Symptoms of Low Vitamin D :  Fatigue and low energy. Bone pain and muscle weakness. Increased risk of fractures or weak bones. Frequent illness or infections. Mood changes, like depression or anxiety. Hair loss or thinning.     If Vitamin B 12 levels low, I advise to consume protein rich foods such as lean meats; poultry; eggs; seafood; beans, peas, and lentils; nuts and seeds; and soy products. Fish and red meat are excellent sources of vitamin B12. I encourage Vitamin B12 [id number] mcg supplements over the counter tablets once daily.    If labs were collected, we will inform you of lab results once received either by echart message or telephone call.   - echart message- for normal results that have been seen by the patient already.   - telephone call: abnormal results or if patient has not viewed results in their echart.   - Please take medications as prescribed. - Follow up with your primary health provider if any health concerns arises. - If symptoms worsen please contact your primary care provider and/or visit the emergency department.

## 2023-05-01 ENCOUNTER — Ambulatory Visit (INDEPENDENT_AMBULATORY_CARE_PROVIDER_SITE_OTHER): Payer: Medicare PPO | Admitting: Family Medicine

## 2023-05-01 ENCOUNTER — Encounter: Payer: Self-pay | Admitting: Family Medicine

## 2023-05-01 VITALS — BP 138/80 | HR 74 | Resp 16 | Ht 64.0 in | Wt 212.0 lb

## 2023-05-01 DIAGNOSIS — Z13820 Encounter for screening for osteoporosis: Secondary | ICD-10-CM

## 2023-05-01 DIAGNOSIS — E1169 Type 2 diabetes mellitus with other specified complication: Secondary | ICD-10-CM | POA: Diagnosis not present

## 2023-05-01 DIAGNOSIS — E538 Deficiency of other specified B group vitamins: Secondary | ICD-10-CM

## 2023-05-01 DIAGNOSIS — E559 Vitamin D deficiency, unspecified: Secondary | ICD-10-CM

## 2023-05-01 DIAGNOSIS — E782 Mixed hyperlipidemia: Secondary | ICD-10-CM

## 2023-05-01 DIAGNOSIS — E119 Type 2 diabetes mellitus without complications: Secondary | ICD-10-CM

## 2023-05-01 NOTE — Assessment & Plan Note (Signed)
 Last Hemoglobin A1c: 6.4 Labs: Ordered today, results pending; will follow up accordingly. The patient reports adhering to prescribed medications:  Metformin  500 mg daily  Reviewed non-pharmacological interventions, including a balanced diet rich in lean proteins, healthy fats, whole grains, and high-fiber vegetables. Emphasized reducing refined sugars and processed carbohydrates, and incorporating more fruits, leafy greens, and legumes. Education: Patient was educated on recognizing signs and symptoms of both hypoglycemia and hyperglycemia, and advised to seek emergency care if these symptoms occur. Patient Understanding: The patient verbalized understanding of the care plan, and all questions were answered. Additional Care: Ophthalmology referral was placed. Foot exam results were within normal limits.

## 2023-05-02 LAB — LIPID PANEL
Chol/HDL Ratio: 2.7 {ratio} (ref 0.0–4.4)
Cholesterol, Total: 133 mg/dL (ref 100–199)
HDL: 49 mg/dL (ref >39)
LDL Chol Calc (NIH): 57 mg/dL (ref 0–99)
Triglycerides: 163 mg/dL — ABNORMAL HIGH (ref 0–149)
VLDL Cholesterol Cal: 27 mg/dL (ref 5–40)

## 2023-05-02 LAB — BMP8+EGFR
BUN/Creatinine Ratio: 25 (ref 12–28)
BUN: 19 mg/dL (ref 8–27)
CO2: 22 mmol/L (ref 20–29)
Calcium: 10 mg/dL (ref 8.7–10.3)
Chloride: 100 mmol/L (ref 96–106)
Creatinine, Ser: 0.77 mg/dL (ref 0.57–1.00)
Glucose: 94 mg/dL (ref 70–99)
Potassium: 4.5 mmol/L (ref 3.5–5.2)
Sodium: 140 mmol/L (ref 134–144)
eGFR: 86 mL/min/{1.73_m2} (ref >59)

## 2023-05-02 LAB — CBC WITH DIFFERENTIAL/PLATELET
Basophils Absolute: 0.1 10*3/uL (ref 0.0–0.2)
Basos: 1 %
EOS (ABSOLUTE): 0.1 10*3/uL (ref 0.0–0.4)
Eos: 2 %
Hematocrit: 43.6 % (ref 34.0–46.6)
Hemoglobin: 14.5 g/dL (ref 11.1–15.9)
Immature Grans (Abs): 0 10*3/uL (ref 0.0–0.1)
Immature Granulocytes: 0 %
Lymphocytes Absolute: 2.8 10*3/uL (ref 0.7–3.1)
Lymphs: 32 %
MCH: 28.8 pg (ref 26.6–33.0)
MCHC: 33.3 g/dL (ref 31.5–35.7)
MCV: 87 fL (ref 79–97)
Monocytes Absolute: 0.6 10*3/uL (ref 0.1–0.9)
Monocytes: 7 %
Neutrophils Absolute: 5 10*3/uL (ref 1.4–7.0)
Neutrophils: 58 %
Platelets: 283 10*3/uL (ref 150–450)
RBC: 5.03 x10E6/uL (ref 3.77–5.28)
RDW: 14.9 % (ref 11.7–15.4)
WBC: 8.7 10*3/uL (ref 3.4–10.8)

## 2023-05-02 LAB — HEMOGLOBIN A1C
Est. average glucose Bld gHb Est-mCnc: 131 mg/dL
Hgb A1c MFr Bld: 6.2 % — ABNORMAL HIGH (ref 4.8–5.6)

## 2023-05-02 LAB — VITAMIN D 25 HYDROXY (VIT D DEFICIENCY, FRACTURES): Vit D, 25-Hydroxy: 27.4 ng/mL — ABNORMAL LOW (ref 30.0–100.0)

## 2023-06-15 ENCOUNTER — Other Ambulatory Visit: Payer: Self-pay | Admitting: Family Medicine

## 2023-07-29 ENCOUNTER — Other Ambulatory Visit: Payer: Self-pay | Admitting: Family Medicine

## 2023-10-23 ENCOUNTER — Telehealth: Payer: Self-pay | Admitting: Family Medicine

## 2023-10-23 NOTE — Telephone Encounter (Signed)
 Called pt to see if she has had her yearly eye exam. She indicated she had not. I offered to schedule her an appt to have it done in our office and she declined

## 2023-10-30 ENCOUNTER — Ambulatory Visit: Payer: Medicare PPO | Admitting: Family Medicine

## 2023-11-07 ENCOUNTER — Ambulatory Visit (INDEPENDENT_AMBULATORY_CARE_PROVIDER_SITE_OTHER): Admitting: Family Medicine

## 2023-11-07 VITALS — BP 141/90 | HR 81 | Ht 64.0 in | Wt 218.0 lb

## 2023-11-07 DIAGNOSIS — M810 Age-related osteoporosis without current pathological fracture: Secondary | ICD-10-CM | POA: Diagnosis not present

## 2023-11-07 DIAGNOSIS — G5603 Carpal tunnel syndrome, bilateral upper limbs: Secondary | ICD-10-CM

## 2023-11-07 DIAGNOSIS — E559 Vitamin D deficiency, unspecified: Secondary | ICD-10-CM | POA: Diagnosis not present

## 2023-11-07 DIAGNOSIS — E038 Other specified hypothyroidism: Secondary | ICD-10-CM | POA: Diagnosis not present

## 2023-11-07 DIAGNOSIS — E1169 Type 2 diabetes mellitus with other specified complication: Secondary | ICD-10-CM

## 2023-11-07 DIAGNOSIS — G5602 Carpal tunnel syndrome, left upper limb: Secondary | ICD-10-CM | POA: Insufficient documentation

## 2023-11-07 DIAGNOSIS — Z7984 Long term (current) use of oral hypoglycemic drugs: Secondary | ICD-10-CM

## 2023-11-07 DIAGNOSIS — E782 Mixed hyperlipidemia: Secondary | ICD-10-CM

## 2023-11-07 DIAGNOSIS — G5600 Carpal tunnel syndrome, unspecified upper limb: Secondary | ICD-10-CM | POA: Insufficient documentation

## 2023-11-07 DIAGNOSIS — R030 Elevated blood-pressure reading, without diagnosis of hypertension: Secondary | ICD-10-CM | POA: Diagnosis not present

## 2023-11-07 NOTE — Patient Instructions (Signed)

## 2023-11-07 NOTE — Assessment & Plan Note (Signed)
 Last Hemoglobin A1c: 6.2 Labs: Ordered today, results pending; will follow up accordingly. The patient reports adhering to prescribed medications:  Metformin  500 mg daily  Reviewed non-pharmacological interventions, including a balanced diet rich in lean proteins, healthy fats, whole grains, and high-fiber vegetables. Emphasized reducing refined sugars and processed carbohydrates, and incorporating more fruits, leafy greens, and legumes. Education: Patient was educated on recognizing signs and symptoms of both hypoglycemia and hyperglycemia, and advised to seek emergency care if these symptoms occur. Patient Understanding: The patient verbalized understanding of the care plan, and all questions were answered. Additional Care: Ophthalmology referral was placed. Foot exam results were within normal limits.

## 2023-11-07 NOTE — Assessment & Plan Note (Signed)
 Patient presents with symptoms consistent with carpal tunnel syndrome of the both upper extremity. Reports numbness, tingling, and/or pain in the thumb, index, and middle fingers of the right hand. Symptoms may worsen at night or with repetitive hand/wrist movements. Positive findings on physical exam  Tinel's sign. Referral to Orthopedics

## 2023-11-07 NOTE — Assessment & Plan Note (Signed)
 Vitals:   11/07/23 0932 11/07/23 1000  BP: (!) 153/82 (!) 141/90  Follow up in 2 weeks to recheck blood pressure reading Patient currently not taking any blood pressure medications Labs ordered Continued discussion on DASH diet, low sodium diet and maintain a exercise routine for 150 minutes per week.

## 2023-11-07 NOTE — Progress Notes (Signed)
 Established Patient Office Visit   Subjective  Patient ID: Ashley Serrano, female    DOB: 1955/11/06  Age: 68 y.o. MRN: [id number]  Chief Complaint  Patient presents with   Diabetes    She  has a past medical history of Arthritis, Depression, Diabetes mellitus without complication (HCC), Multiple gastric ulcers, and Seasonal allergies.  HPI Patient presents to the clinic for chronic follow up. For the details of today's visit, please refer to assessment and plan.   Review of Systems  Constitutional:  Negative for chills and fever.  Respiratory:  Negative for shortness of breath.   Cardiovascular:  Negative for chest pain.  Genitourinary:  Negative for dysuria.  Neurological:  Negative for dizziness and headaches.      Objective:     BP (!) 141/90   Pulse 81   Ht 5' 4 (1.626 m)   Wt 218 lb (98.9 kg)   SpO2 95%   BMI 37.42 kg/m  BP Readings from Last 3 Encounters:  11/07/23 (!) 141/90  05/01/23 138/80  02/26/23 (!) 141/66      Physical Exam Vitals reviewed.  Constitutional:      General: She is not in acute distress.    Appearance: Normal appearance. She is not ill-appearing, toxic-appearing or diaphoretic.  HENT:     Head: Normocephalic.  Eyes:     General:        Right eye: No discharge.        Left eye: No discharge.     Conjunctiva/sclera: Conjunctivae normal.  Cardiovascular:     Rate and Rhythm: Normal rate.     Pulses: Normal pulses.     Heart sounds: Normal heart sounds.  Pulmonary:     Effort: Pulmonary effort is normal. No respiratory distress.     Breath sounds: Normal breath sounds.  Abdominal:     General: Bowel sounds are normal.     Palpations: Abdomen is soft.     Tenderness: There is no abdominal tenderness. There is no right CVA tenderness, left CVA tenderness or guarding.  Musculoskeletal:        General: Swelling and tenderness present.     Right hand: Decreased range of motion. Decreased strength. Decreased sensation.     Left  hand: Decreased range of motion. Decreased strength. Decreased sensation.     Cervical back: Normal range of motion.  Skin:    General: Skin is warm and dry.  Neurological:     Mental Status: She is alert.  Psychiatric:        Mood and Affect: Mood normal.        Behavior: Behavior normal.      No results found for any visits on 11/07/23.  The 10-year ASCVD risk score (Arnett DK, et al., 2019) is: 19%    Assessment & Plan:  Type 2 diabetes mellitus with other specified complication, without long-term current use of insulin (HCC) Assessment & Plan: Last Hemoglobin A1c: 6.2 Labs: Ordered today, results pending; will follow up accordingly. The patient reports adhering to prescribed medications:  Metformin  500 mg daily  Reviewed non-pharmacological interventions, including a balanced diet rich in lean proteins, healthy fats, whole grains, and high-fiber vegetables. Emphasized reducing refined sugars and processed carbohydrates, and incorporating more fruits, leafy greens, and legumes. Education: Patient was educated on recognizing signs and symptoms of both hypoglycemia and hyperglycemia, and advised to seek emergency care if these symptoms occur. Patient Understanding: The patient verbalized understanding of the care plan, and all questions  were answered. Additional Care: Ophthalmology referral was placed. Foot exam results were within normal limits.     Orders: -     Hemoglobin A1c -     Ambulatory referral to Ophthalmology  Mixed hyperlipidemia -     Lipid panel -     CMP14+EGFR -     CBC with Differential/Platelet  TSH (thyroid-stimulating hormone deficiency) -     TSH + free T4  Vitamin D  deficiency -     VITAMIN D  25 Hydroxy (Vit-D Deficiency, Fractures)  Age related osteoporosis, unspecified pathological fracture presence -     DG Bone Density; Future  Bilateral carpal tunnel syndrome Assessment & Plan: Patient presents with symptoms consistent with carpal tunnel  syndrome of the both upper extremity. Reports numbness, tingling, and/or pain in the thumb, index, and middle fingers of the right hand. Symptoms may worsen at night or with repetitive hand/wrist movements. Positive findings on physical exam  Tinel's sign. Referral to Orthopedics   Orders: -     Ambulatory referral to Orthopedics  Elevated blood pressure reading Assessment & Plan: Vitals:   11/07/23 0932 11/07/23 1000  BP: (!) 153/82 (!) 141/90  Follow up in 2 weeks to recheck blood pressure reading Patient currently not taking any blood pressure medications Labs ordered Continued discussion on DASH diet, low sodium diet and maintain a exercise routine for 150 minutes per week.      Return in about 6 months (around 05/09/2024), or if symptoms worsen or fail to improve, for type 2 diabetes and 2 week follow up for blood pressure reading check.   Hilario Kidd Wilhelmena Falter, FNP

## 2023-11-08 ENCOUNTER — Other Ambulatory Visit: Payer: Self-pay | Admitting: Family Medicine

## 2023-11-08 ENCOUNTER — Ambulatory Visit: Payer: Self-pay | Admitting: Family Medicine

## 2023-11-08 LAB — CMP14+EGFR
ALT: 17 IU/L (ref 0–32)
AST: 15 IU/L (ref 0–40)
Albumin: 4.8 g/dL (ref 3.9–4.9)
Alkaline Phosphatase: 79 IU/L (ref 44–121)
BUN/Creatinine Ratio: 22 (ref 12–28)
BUN: 18 mg/dL (ref 8–27)
Bilirubin Total: 0.4 mg/dL (ref 0.0–1.2)
CO2: 21 mmol/L (ref 20–29)
Calcium: 10.2 mg/dL (ref 8.7–10.3)
Chloride: 100 mmol/L (ref 96–106)
Creatinine, Ser: 0.83 mg/dL (ref 0.57–1.00)
Globulin, Total: 2.5 g/dL (ref 1.5–4.5)
Glucose: 91 mg/dL (ref 70–99)
Potassium: 4.7 mmol/L (ref 3.5–5.2)
Sodium: 140 mmol/L (ref 134–144)
Total Protein: 7.3 g/dL (ref 6.0–8.5)
eGFR: 78 mL/min/1.73

## 2023-11-08 LAB — TSH+FREE T4
Free T4: 1.27 ng/dL (ref 0.82–1.77)
TSH: 1.99 u[IU]/mL (ref 0.450–4.500)

## 2023-11-08 LAB — LIPID PANEL
Chol/HDL Ratio: 3 ratio (ref 0.0–4.4)
Cholesterol, Total: 152 mg/dL (ref 100–199)
HDL: 51 mg/dL (ref >39)
LDL Chol Calc (NIH): 64 mg/dL (ref 0–99)
Triglycerides: 230 mg/dL — ABNORMAL HIGH (ref 0–149)
VLDL Cholesterol Cal: 37 mg/dL (ref 5–40)

## 2023-11-08 LAB — CBC WITH DIFFERENTIAL/PLATELET
Basophils Absolute: 0.1 x10E3/uL (ref 0.0–0.2)
Basos: 1 %
EOS (ABSOLUTE): 0.2 x10E3/uL (ref 0.0–0.4)
Eos: 2 %
Hematocrit: 44.1 % (ref 34.0–46.6)
Hemoglobin: 14.6 g/dL (ref 11.1–15.9)
Immature Grans (Abs): 0 x10E3/uL (ref 0.0–0.1)
Immature Granulocytes: 0 %
Lymphocytes Absolute: 3.2 x10E3/uL — ABNORMAL HIGH (ref 0.7–3.1)
Lymphs: 36 %
MCH: 29 pg (ref 26.6–33.0)
MCHC: 33.1 g/dL (ref 31.5–35.7)
MCV: 88 fL (ref 79–97)
Monocytes Absolute: 0.6 x10E3/uL (ref 0.1–0.9)
Monocytes: 7 %
Neutrophils Absolute: 4.8 x10E3/uL (ref 1.4–7.0)
Neutrophils: 54 %
Platelets: 311 x10E3/uL (ref 150–450)
RBC: 5.04 x10E6/uL (ref 3.77–5.28)
RDW: 15.1 % (ref 11.7–15.4)
WBC: 8.9 x10E3/uL (ref 3.4–10.8)

## 2023-11-08 LAB — HEMOGLOBIN A1C
Est. average glucose Bld gHb Est-mCnc: 137 mg/dL
Hgb A1c MFr Bld: 6.4 % — ABNORMAL HIGH (ref 4.8–5.6)

## 2023-11-08 LAB — VITAMIN D 25 HYDROXY (VIT D DEFICIENCY, FRACTURES): Vit D, 25-Hydroxy: 30.7 ng/mL (ref 30.0–100.0)

## 2023-11-08 MED ORDER — ROSUVASTATIN CALCIUM 40 MG PO TABS: 40.0000 mg | ORAL_TABLET | Freq: Every day | ORAL | 3 refills | Status: AC

## 2023-11-08 MED ORDER — METFORMIN HCL 500 MG PO TABS: ORAL_TABLET | ORAL | 3 refills | Status: AC

## 2023-11-08 NOTE — Progress Notes (Signed)
 I encourage over the counter omega-3 fish oil 1000 mg twice daily.

## 2023-11-08 NOTE — Progress Notes (Signed)
 Please inform patient,  Hemoglobin A1c 6.4 Plan: Continue Metformin  500 mg once daily   Triglycerides levels elevated, start lifestyle modifications and follow diet low in saturated fat. Plan: Increase Crestor  to 40 mg once daily- medication sent to pharmacy  Diet tips Eat More: Oats, beans, and lentils: High in soluble fiber. Fatty fish: Salmon, tuna (rich in omega-3s). Nuts and seeds: Almonds, walnuts, flaxseeds. Fruits and vegetables: Apples, berries, leafy greens. Healthy fats: Olive oil, avocado. Limit: Saturated fats: Butter, cream, fatty meats. Trans fats: Fried foods, processed snacks. Sugar and refined carbs: Sweets, white bread. Focus on whole foods, healthy fats, and fiber to improve heart health! Maintain an exercise routine 3 to 5 days a week for a minimum total of 150 minutes. I encourage over the counter omega-3 fish oil 1000 mg twice daily.    Kidney and thyroid function normal

## 2023-11-19 ENCOUNTER — Ambulatory Visit

## 2023-11-19 DIAGNOSIS — I10 Essential (primary) hypertension: Secondary | ICD-10-CM | POA: Diagnosis not present

## 2023-11-19 NOTE — Progress Notes (Signed)
 Patient is in office today for a nurse visit for Blood Pressure Check. Patient blood pressure was 153/71, Patient No chest pain

## 2023-11-21 ENCOUNTER — Ambulatory Visit (INDEPENDENT_AMBULATORY_CARE_PROVIDER_SITE_OTHER): Admitting: Orthopaedic Surgery

## 2023-11-21 ENCOUNTER — Encounter: Payer: Self-pay | Admitting: Orthopaedic Surgery

## 2023-11-21 VITALS — BP 145/74 | HR 109 | Ht 64.0 in | Wt 216.0 lb

## 2023-11-21 DIAGNOSIS — G5603 Carpal tunnel syndrome, bilateral upper limbs: Secondary | ICD-10-CM | POA: Diagnosis not present

## 2023-11-21 DIAGNOSIS — M65332 Trigger finger, left middle finger: Secondary | ICD-10-CM

## 2023-11-21 DIAGNOSIS — M65342 Trigger finger, left ring finger: Secondary | ICD-10-CM

## 2023-11-21 MED ORDER — METHYLPREDNISOLONE ACETATE 40 MG/ML IJ SUSP
40.0000 mg | Freq: Once | INTRAMUSCULAR | Status: AC
  Administered 2023-11-21: 40 mg via INTRA_ARTICULAR

## 2023-11-21 NOTE — Patient Instructions (Signed)
 Referral has been placed to Dr. Gardiner Masters with [facility], someone from that office will call to get you scheduled. Please call [phone] if you have not heard from them in a couple days.

## 2023-11-21 NOTE — Progress Notes (Signed)
 Subjective:    Patient ID: Ashley Serrano, female    DOB: 01-02-1956, 68 y.o.   MRN: [id number]  HPI She has had numbness of both hands, more at night, over the last several months, getting worse.  It is in the median nerve distribution.  The left hand is more affected.  She also has locking of the left ring and long fingers.  Nothing helps. She has tried ice, heat, rest, Tylenol with no relief.  She has no trauma, no redness, no swelling.   Review of Systems  Constitutional:  Positive for activity change.  Musculoskeletal:  Positive for arthralgias and myalgias.  Allergic/Immunologic: Positive for environmental allergies.  All other systems reviewed and are negative. For Review of Systems, all other systems reviewed and are negative.  The following is a summary of the past history medically, past history surgically, known current medicines, social history and family history.  This information is gathered electronically by the computer from prior information and documentation.  I review this each visit and have found including this information at this point in the chart is beneficial and informative.   Past Medical History:  Diagnosis Date   Arthritis    Depression    Diabetes mellitus without complication (HCC)    Multiple gastric ulcers    Seasonal allergies     Past Surgical History:  Procedure Laterality Date   ABDOMINAL HYSTERECTOMY     APPENDECTOMY     CHOLECYSTECTOMY     COLONOSCOPY WITH PROPOFOL  N/A 02/26/2023   Procedure: COLONOSCOPY WITH PROPOFOL ;  Surgeon: Eartha Angelia Sieving, MD;  Location: [facility];  Service: Gastroenterology;  Laterality: N/A;  10:45AM;ASA 1   HERNIA REPAIR     KNEE SURGERY Left    POLYPECTOMY  02/26/2023   Procedure: POLYPECTOMY;  Surgeon: Eartha Angelia, Sieving, MD;  Location: [facility];  Service: Gastroenterology;;    Current Outpatient Medications on File Prior to Visit  Medication Sig Dispense Refill   acetaminophen  (TYLENOL) 650 MG CR tablet Take 650 mg by mouth every 8 (eight) hours as needed for pain.     buPROPion  (WELLBUTRIN  XL) 300 MG 24 hr tablet TAKE 1 TABLET BY MOUTH EVERY DAY 90 tablet 2   cetirizine  (ZYRTEC ) 10 MG tablet Take 1 tablet (10 mg total) by mouth daily. 30 tablet 0   cyanocobalamin (VITAMIN B12) 1000 MCG tablet Take 1,000 mcg by mouth daily.     DULoxetine  (CYMBALTA ) 30 MG capsule TAKE 1 CAPSULE BY MOUTH EVERY DAY 90 capsule 2   meloxicam  (MOBIC ) 7.5 MG tablet TAKE 1 TABLET (7.5 MG TOTAL) BY MOUTH ONCE AS NEEDED FOR UP TO 1 DOSE FOR PAIN. 30 tablet 1   metFORMIN  (GLUCOPHAGE ) 500 MG tablet TAKE 1 TABLET (500 MG TOTAL) BY MOUTH DAILY. PLEASE TAKE DAILY WITH A MEAL 90 tablet 3   OVER THE COUNTER MEDICATION Vit D 25 mcg daily.     rosuvastatin  (CRESTOR ) 40 MG tablet Take 1 tablet (40 mg total) by mouth daily. 90 tablet 3   No current facility-administered medications on file prior to visit.    Social History   Socioeconomic History   Marital status: Divorced    Spouse name: Not on file   Number of children: Not on file   Years of education: Not on file   Highest education level: Not on file  Occupational History   Not on file  Tobacco Use   Smoking status: Every Day    Current packs/day: 1.00  Types: Cigarettes   Smokeless tobacco: Never  Vaping Use   Vaping status: Never Used  Substance and Sexual Activity   Alcohol use: Not on file   Drug use: Never   Sexual activity: Not Currently  Other Topics Concern   Not on file  Social History Narrative   Not on file   Social Drivers of Health   Financial Resource Strain: Not on file  Food Insecurity: Not on file  Transportation Needs: Not on file  Physical Activity: Not on file  Stress: Not on file  Social Connections: Not on file  Intimate Partner Violence: Not on file    Family History  Problem Relation Age of Onset   Cancer Mother    Thyroid disease Mother    Hypertension Father    Alcoholism Sister     Seizures Sister    Arthritis Sister    Cancer Brother    Arthritis Brother    Ulcers Maternal Grandmother     BP (!) 145/74   Pulse (!) 109   Ht 5' 4 (1.626 m)   Wt 216 lb (98 kg)   BMI 37.08 kg/m   Body mass index is 37.08 kg/m.      Objective:   Physical Exam Vitals and nursing note reviewed. Exam conducted with a chaperone present.  Constitutional:      Appearance: She is well-developed.  HENT:     Head: Normocephalic and atraumatic.  Eyes:     Conjunctiva/sclera: Conjunctivae normal.     Pupils: Pupils are equal, round, and reactive to light.  Cardiovascular:     Rate and Rhythm: Normal rate and regular rhythm.  Pulmonary:     Effort: Pulmonary effort is normal.  Abdominal:     Palpations: Abdomen is soft.  Musculoskeletal:       Hands:     Cervical back: Normal range of motion and neck supple.  Skin:    General: Skin is warm and dry.  Neurological:     Mental Status: She is alert and oriented to person, place, and time.     Cranial Nerves: No cranial nerve deficit.     Motor: No abnormal muscle tone.     Coordination: Coordination normal.     Deep Tendon Reflexes: Reflexes are normal and symmetric. Reflexes normal.  Psychiatric:        Behavior: Behavior normal.        Thought Content: Thought content normal.        Judgment: Judgment normal.           Assessment & Plan:   Encounter Diagnoses  Name Primary?   Bilateral carpal tunnel syndrome Yes   Trigger finger, left middle finger    Trigger finger, left ring finger    PROCEDURE  Trigger Finger Injection  The left Middle finger  has been locking at the A1 pulley.  The patient has been told about injection of the digit.  Surgical correction and excision of the A1 pulley will resolve the problem.  Ani injection in the digit should help but the results may be short lived.  The patient asked appropriate questions and understands the procedure.  The patient has elected for an injection at this  time.  Verbal consent was obtained.  A timeout was taken to confirm the proper hand and digit.  Medication  1 mL of DepoMedrol 40 mg  2 mL of 1% lidocaine  plain  Ethyl chloride for anesthesia  Alcohol was used to prepare the skin along with  ethyl chloride and then the injection was made at the A1 pulley there were no complications.  It was tolerated well.  A Band-aid dressing was applied.  Call if any problem or difficulty.  PROCEDURE  Trigger Finger Injection  The left Ring finger  has been locking at the A1 pulley.  The patient has been told about injection of the digit.  Surgical correction and excision of the A1 pulley will resolve the problem.  Ani injection in the digit should help but the results may be short lived.  The patient asked appropriate questions and understands the procedure.  The patient has elected for an injection at this time.  Verbal consent was obtained.  A timeout was taken to confirm the proper hand and digit.  Medication  1 mL of DepoMedrol 40 mg  2 mL of 1% lidocaine  plain  Ethyl chloride for anesthesia  Alcohol was used to prepare the skin along with ethyl chloride and then the injection was made at the A1 pulley there were no complications.  It was tolerated well.  A Band-aid dressing was applied.  Call if any problem or difficulty.  To set up EMG study of both upper extremities.  I have explained what carpal tunnel is.  I have also told her surgery is the definitive treatment for the trigger fingers.  She wanted the injections today.  Return in two weeks.  Call if any problem.  Precautions discussed.  Electronically Signed Lemond Stable, MD 8/7/20252:15 PM

## 2023-11-22 ENCOUNTER — Other Ambulatory Visit: Payer: Self-pay | Admitting: Family Medicine

## 2023-11-22 MED ORDER — LISINOPRIL 2.5 MG PO TABS: 2.5000 mg | ORAL_TABLET | Freq: Every day | ORAL | 2 refills | Status: DC

## 2023-12-04 ENCOUNTER — Ambulatory Visit: Admitting: Orthopaedic Surgery

## 2023-12-06 ENCOUNTER — Ambulatory Visit (INDEPENDENT_AMBULATORY_CARE_PROVIDER_SITE_OTHER): Admitting: Physical Medicine and Rehabilitation

## 2023-12-06 DIAGNOSIS — M25532 Pain in left wrist: Secondary | ICD-10-CM

## 2023-12-06 DIAGNOSIS — R202 Paresthesia of skin: Secondary | ICD-10-CM | POA: Diagnosis not present

## 2023-12-06 DIAGNOSIS — R29898 Other symptoms and signs involving the musculoskeletal system: Secondary | ICD-10-CM | POA: Diagnosis not present

## 2023-12-06 NOTE — Progress Notes (Signed)
 No numbness or tingling in right arm Trigger finger right index finger  Left handed  2nd-5th fingers numb left hand

## 2023-12-08 NOTE — Procedures (Unsigned)
 EMG & NCV Findings: Evaluation of the left median motor nerve showed prolonged distal onset latency (13.8 ms), reduced amplitude (0.1 mV), and decreased conduction velocity (Elbow-Wrist, 43 m/s).  The left median (across palm) sensory nerve showed prolonged distal peak latency (Wrist, 10.1 ms) and reduced amplitude (4.8 V).  All remaining nerves (as indicated in the following tables) were within normal limits.    Needle evaluation of the left abductor pollicis brevis muscle showed increased insertional activity, increased spontaneous activity, and diminished recruitment.  All remaining muscles (as indicated in the following table) showed no evidence of electrical instability.    Impression: The above electrodiagnostic study is ABNORMAL and reveals evidence of a severe left median nerve entrapment at the wrist (carpal tunnel syndrome) affecting sensory and motor components. The lesion is characterized by sensory and motor demyelination with evidence of significant axonal injury.   There is no significant electrodiagnostic evidence of any other focal nerve entrapment, brachial plexopathy or cervical radiculopathy.   Recommendations: 1.  Follow-up with referring physician. 2.  Continue current management of symptoms. 3.  Suggest surgical evaluation.  ___________________________ Prentice Masters FAAPMR Board Certified, American Board of Physical Medicine and Rehabilitation    Nerve Conduction Studies Anti Sensory Summary Table   Stim Site NR Peak (ms) Norm Peak (ms) P-T Amp (V) Norm P-T Amp Site1 Site2 Delta-P (ms) Dist (cm) Vel (m/s) Norm Vel (m/s)  Left Median Acr Palm Anti Sensory (2nd Digit)  32.1C  Wrist    *10.1 <3.6 *4.8 >10 Wrist Palm 8.5 0.0    Palm    1.6 <2.0 14.5         Left Radial Anti Sensory (Base 1st Digit)  31.7C  Wrist    1.9 <3.1 14.3  Wrist Base 1st Digit 1.9 0.0    Left Ulnar Anti Sensory (5th Digit)  32.4C  Wrist    3.4 <3.7 24.0 >15.0 Wrist 5th Digit 3.4 14.0 41 >38    Motor Summary Table   Stim Site NR Onset (ms) Norm Onset (ms) O-P Amp (mV) Norm O-P Amp Site1 Site2 Delta-0 (ms) Dist (cm) Vel (m/s) Norm Vel (m/s)  Left Median Motor (Abd Poll Brev)  32C  Wrist    *13.8 <4.2 *0.1 >5 Elbow Wrist 4.4 19.0 *43 >50  Elbow    18.2  0.1         Left Ulnar Motor (Abd Dig Min)  32.3C  Wrist    2.8 <4.2 7.3 >3 B Elbow Wrist 3.6 19.0 53 >53  B Elbow    6.4  5.5  A Elbow B Elbow 2.2 20.0 91 >53  A Elbow    8.6  5.9          EMG   Side Muscle Nerve Root Ins Act Fibs Psw Amp Dur Poly Recrt Int Bruna Comment  Left Abd Poll Brev Median C8-T1 *Incr *3+ *3+ Nml Nml 0 *Reduced Nml   Left 1stDorInt Ulnar C8-T1 Nml Nml Nml Nml Nml 0 Nml Nml   Left PronatorTeres Median C6-7 Nml Nml Nml Nml Nml 0 Nml Nml     Nerve Conduction Studies Anti Sensory Left/Right Comparison   Stim Site L Lat (ms) R Lat (ms) L-R Lat (ms) L Amp (V) R Amp (V) L-R Amp (%) Site1 Site2 L Vel (m/s) R Vel (m/s) L-R Vel (m/s)  Median Acr Palm Anti Sensory (2nd Digit)  32.1C  Wrist *10.1   *4.8   Wrist Palm     Palm 1.6   14.5  Radial Anti Sensory (Base 1st Digit)  31.7C  Wrist 1.9   14.3   Wrist Base 1st Digit     Ulnar Anti Sensory (5th Digit)  32.4C  Wrist 3.4   24.0   Wrist 5th Digit 41     Motor Left/Right Comparison   Stim Site L Lat (ms) R Lat (ms) L-R Lat (ms) L Amp (mV) R Amp (mV) L-R Amp (%) Site1 Site2 L Vel (m/s) R Vel (m/s) L-R Vel (m/s)  Median Motor (Abd Poll Brev)  32C  Wrist *13.8   *0.1   Elbow Wrist *43    Elbow 18.2   0.1         Ulnar Motor (Abd Dig Min)  32.3C  Wrist 2.8   7.3   B Elbow Wrist 53    B Elbow 6.4   5.5   A Elbow B Elbow 91    A Elbow 8.6   5.9            Waveforms:

## 2023-12-08 NOTE — Progress Notes (Unsigned)
 Ashley Serrano - 68 y.o. female MRN [id number]  Date of birth: 1955/04/21  Office Visit Note: Visit Date: 12/06/2023 PCP: Terry Wilhelmena Lloyd Hilario, FNP Referred by: Brenna Lin, MD  Subjective: Chief Complaint  Patient presents with   Left Hand - Numbness   Right Hand - Pain   HPI: Ashley Serrano is a 68 y.o. female who comes in todayHPI   ROS Otherwise per HPI.  Assessment & Plan: Visit Diagnoses:    ICD-10-CM   1. Paresthesia of skin  R20.2 NCV with EMG (electromyography)       Plan: Impression: The above electrodiagnostic study is ABNORMAL and reveals evidence of a severe left median nerve entrapment at the wrist (carpal tunnel syndrome) affecting sensory and motor components. The lesion is characterized by sensory and motor demyelination with evidence of significant axonal injury.   There is no significant electrodiagnostic evidence of any other focal nerve entrapment, brachial plexopathy or cervical radiculopathy.   Recommendations: 1.  Follow-up with referring physician. 2.  Continue current management of symptoms. 3.  Suggest surgical evaluation.  Meds & Orders: No orders of the defined types were placed in this encounter.   Orders Placed This Encounter  Procedures   NCV with EMG (electromyography)    Follow-up: No follow-ups on file.   Procedures: No procedures performed      Clinical History: No specialty comments available.   She reports that she has been smoking cigarettes. She has never used smokeless tobacco.  Recent Labs    12/14/22 1000 05/01/23 0939 11/07/23 1016  HGBA1C 6.4* 6.2* 6.4*    Objective:  VS:  HT:    WT:   BMI:     BP:   HR: bpm  TEMP: ( )  RESP:  Physical Exam  Ortho Exam  Imaging: No results found.  Past Medical/Family/Surgical/Social History: Medications & Allergies reviewed per EMR, new medications updated. Patient Active Problem List   Diagnosis Date Noted   Carpal tunnel syndrome 11/07/2023    Elevated blood pressure reading 11/07/2023   Systolic murmur 12/27/2022   Positive colorectal cancer screening using Cologuard test 12/27/2022   Shortness of breath 12/27/2022   Type 2 diabetes mellitus (HCC) 12/14/2022   Knee pain 12/14/2022   Chronic hip pain 08/03/2022   Depression, recurrent (HCC) 08/03/2022   Past Medical History:  Diagnosis Date   Arthritis    Depression    Diabetes mellitus without complication (HCC)    Multiple gastric ulcers    Seasonal allergies    Family History  Problem Relation Age of Onset   Cancer Mother    Thyroid disease Mother    Hypertension Father    Alcoholism Sister    Seizures Sister    Arthritis Sister    Cancer Brother    Arthritis Brother    Ulcers Maternal Grandmother    Past Surgical History:  Procedure Laterality Date   ABDOMINAL HYSTERECTOMY     APPENDECTOMY     CHOLECYSTECTOMY     COLONOSCOPY WITH PROPOFOL  N/A 02/26/2023   Procedure: COLONOSCOPY WITH PROPOFOL ;  Surgeon: Eartha Angelia Sieving, MD;  Location: [facility];  Service: Gastroenterology;  Laterality: N/A;  10:45AM;ASA 1   HERNIA REPAIR     KNEE SURGERY Left    POLYPECTOMY  02/26/2023   Procedure: POLYPECTOMY;  Surgeon: Eartha Angelia Sieving, MD;  Location: [facility];  Service: Gastroenterology;;   Social History   Occupational History   Not on file  Tobacco Use   Smoking  status: Every Day    Current packs/day: 1.00    Types: Cigarettes   Smokeless tobacco: Never  Vaping Use   Vaping status: Never Used  Substance and Sexual Activity   Alcohol use: Not on file   Drug use: Never   Sexual activity: Not Currently

## 2023-12-10 ENCOUNTER — Encounter: Payer: Self-pay | Admitting: Physical Medicine and Rehabilitation

## 2023-12-10 NOTE — Addendum Note (Signed)
 Addended by: ELDONNA GARDINER POUR on: 12/10/2023 06:47 AM   Modules accepted: Level of Service

## 2023-12-11 ENCOUNTER — Encounter: Payer: Self-pay | Admitting: Orthopaedic Surgery

## 2023-12-11 ENCOUNTER — Ambulatory Visit (INDEPENDENT_AMBULATORY_CARE_PROVIDER_SITE_OTHER): Admitting: Orthopaedic Surgery

## 2023-12-11 DIAGNOSIS — G5602 Carpal tunnel syndrome, left upper limb: Secondary | ICD-10-CM | POA: Diagnosis not present

## 2023-12-11 NOTE — Progress Notes (Signed)
 My hand still goes numb.  She had EMGs with Dr. Eldonna showing severe carpal tunnel on the left.  I have reviewed the notes.  I will have her see Dr. Onesimo or Dr. Margrette for possible surgery.  She agrees. I have explained the procedure to her.  She has positive Phalens and Tinel on the left wrist.  Decreased sensation of the median nerve of the left hand.  ROM is good.  Encounter Diagnosis  Name Primary?   Carpal tunnel syndrome, left upper limb Yes   To see Dr. JAYSON or Dr. VEAR.  Call if any problem.  Precautions discussed.  Electronically Signed Lemond Stable, MD 8/27/202511:09 AM

## 2024-01-08 ENCOUNTER — Ambulatory Visit (INDEPENDENT_AMBULATORY_CARE_PROVIDER_SITE_OTHER): Admitting: Orthopedic Surgery

## 2024-01-08 VITALS — BP 174/79 | HR 90 | Ht 64.0 in | Wt 215.0 lb

## 2024-01-08 DIAGNOSIS — G5602 Carpal tunnel syndrome, left upper limb: Secondary | ICD-10-CM

## 2024-01-08 NOTE — Progress Notes (Signed)
 Ashley Serrano

## 2024-01-08 NOTE — Progress Notes (Signed)
 Office Visit Note   Patient: Ashley Serrano           Date of Birth: Jul 17, 1955           MRN: [id number] Visit Date: 01/08/2024 Requested by: Terry Wilhelmena Lloyd Hilario, FNP [phone] [address] [place],  [place] [address] PCP: Terry Wilhelmena Lloyd Hilario, FNP  Subjective: Chief Complaint  Patient presents with   Wrist Pain    Left - Carpal Tunnel here to discuss surgery    HPI: 68 year old female presents with carpal tunnel like syndrome left upper extremity with positive nerve conduction study with severe demyelination.  She was previously seen by Dr. Brenna her symptoms were so severe she went for immediate nerve conduction studies  She has severe demyelination  Her index thumb ring finger are numb from the MCP joints distally the thumb is also numb.  She has difficulty picking things up with her left hand and she is left-hand dominant                ROS: Trigger fingers long and ring previous injections currently stable  Assessment & Plan:  Images personally read and my interpretation : No imaging  Visit Diagnoses:  1. Carpal tunnel syndrome, left upper limb     Plan: Carpal tunnel release left upper extremity  We discussed that she may not get full recovery secondary to the demyelination We discussed pillar pain and wound issues that can be related to surgery    PLAN:   Open left carpal tunnel release    Follow-Up Instructions: Return for POST OP 15 days after surgery.   Orders:  No orders of the defined types were placed in this encounter.     Objective: Vital Signs: BP (!) 174/79   Pulse 90   Ht 5' 4 (1.626 m)   Wt 215 lb (97.5 kg)   BMI 36.90 kg/m       Allergies  Allergen Reactions   Wool Alcohol [Lanolin] Hives    Causing swelling and rash  '  Current Outpatient Medications  Medication Instructions   acetaminophen (TYLENOL) 650 mg, Every 8 hours PRN   buPROPion  (WELLBUTRIN  XL) 300 mg, Oral, Daily   cetirizine  (ZYRTEC ) 10 mg,  Oral, Daily   cyanocobalamin (VITAMIN B12) 1,000 mcg, Daily   DULoxetine  (CYMBALTA ) 30 MG capsule Oral, Daily   lisinopril  (ZESTRIL ) 2.5 mg, Oral, Daily   meloxicam  (MOBIC ) 7.5 mg, Oral, Once PRN   metFORMIN  (GLUCOPHAGE ) 500 MG tablet TAKE 1 TABLET (500 MG TOTAL) BY MOUTH DAILY. PLEASE TAKE DAILY WITH A MEAL   OVER THE COUNTER MEDICATION Vit D 25 mcg daily.   rosuvastatin  (CRESTOR ) 40 mg, Oral, Daily     Past Medical History:  Diagnosis Date   Arthritis    Depression    Diabetes mellitus without complication (HCC)    Multiple gastric ulcers    Seasonal allergies     Past Surgical History:  Procedure Laterality Date   ABDOMINAL HYSTERECTOMY     APPENDECTOMY     CHOLECYSTECTOMY     COLONOSCOPY WITH PROPOFOL  N/A 02/26/2023   Procedure: COLONOSCOPY WITH PROPOFOL ;  Surgeon: Eartha Angelia Sieving, MD;  Location: [facility];  Service: Gastroenterology;  Laterality: N/A;  10:45AM;ASA 1   HERNIA REPAIR     KNEE SURGERY Left    POLYPECTOMY  02/26/2023   Procedure: POLYPECTOMY;  Surgeon: Eartha Angelia Sieving, MD;  Location: [facility];  Service: Gastroenterology;;    Family History  Problem Relation Age of Onset   Cancer Mother    Thyroid disease Mother    Hypertension Father    Alcoholism Sister    Seizures Sister    Arthritis Sister    Cancer Brother    Arthritis Brother    Ulcers Maternal Grandmother      Social History   Tobacco Use   Smoking status: Every Day    Current packs/day: 1.00    Types: Cigarettes   Smokeless tobacco: Never  Vaping Use   Vaping status: Never Used  Substance Use Topics   Drug use: Never    Allergies  Allergen Reactions   Wool Alcohol [Lanolin] Hives    Causing swelling and rash    Current Outpatient Medications  Medication Sig Dispense Refill   acetaminophen (TYLENOL) 650 MG CR tablet Take 650 mg by mouth every 8 (eight) hours as needed for pain.     buPROPion  (WELLBUTRIN  XL) 300 MG 24 hr tablet TAKE 1 TABLET BY  MOUTH EVERY DAY 90 tablet 2   cetirizine  (ZYRTEC ) 10 MG tablet Take 1 tablet (10 mg total) by mouth daily. 30 tablet 0   cyanocobalamin (VITAMIN B12) 1000 MCG tablet Take 1,000 mcg by mouth daily.     DULoxetine  (CYMBALTA ) 30 MG capsule TAKE 1 CAPSULE BY MOUTH EVERY DAY 90 capsule 2   lisinopril  (ZESTRIL ) 2.5 MG tablet Take 1 tablet (2.5 mg total) by mouth daily. 30 tablet 2   meloxicam  (MOBIC ) 7.5 MG tablet TAKE 1 TABLET (7.5 MG TOTAL) BY MOUTH ONCE AS NEEDED FOR UP TO 1 DOSE FOR PAIN. 30 tablet 1   metFORMIN  (GLUCOPHAGE ) 500 MG tablet TAKE 1 TABLET (500 MG TOTAL) BY MOUTH DAILY. PLEASE TAKE DAILY WITH A MEAL 90 tablet 3   OVER THE COUNTER MEDICATION Vit D 25 mcg daily.     rosuvastatin  (CRESTOR ) 40 MG tablet Take 1 tablet (40 mg total) by mouth daily. 90 tablet 3   No current facility-administered medications for this visit.     Physical Exam BP (!) 174/79   Pulse 90   Ht 5' 4 (1.626 m)   Wt 215 lb (97.5 kg)   BMI 36.90 kg/m   The patient is well developed well nourished and well groomed.  Orientation to person place and time is normal  Mood is pleasant.  Ambulatory status normal gait and stance   Left upper extremity examination reveals the following:  SWELLING no TENDERNESS OVER THE CARPAL TUNNEL yes  Range of motion of the wrist normal  Motor exam normal  Wrist joint is stable  Provocative tests for carpal tunnel Phalen's test positive Carpal tunnel compression test positive Tinel's test negative  Pulses are normal in the radial and ulnar artery with a normal Allen's test.  Decreased sensation in the thumb index long and ring finger

## 2024-01-08 NOTE — Patient Instructions (Signed)
 Your surgery will be at [facility] by Dr Margrette  The hospital will contact you with a preoperative appointment to discuss Anesthesia.  Please arrive on time or 15 minutes early for the preoperative appointment, they have a very tight schedule if you are late or do not come in your surgery will be cancelled.  The phone number is [phone]. Please bring your medications with you for the appointment. They will tell you the arrival time and medication instructions when you have your preoperative evaluation. Do not wear nail polish the day of your surgery and if you take Phentermine you need to stop this medication ONE WEEK prior to your surgery. If you take Invokana, Farxiga, Jardiance, or Steglatro) - Hold 72 hours before the procedure.  If you take Ozempic,  Mounjaro, Bydureon or Trulicity do not take for 8 days before your surgery. If you take Victoza, Rybelsis, Saxenda or Adlyxi stop 24 hours before the procedure.  Please arrive at the hospital 2 hours before procedure if scheduled at 9:30 or later in the day or at the time the nurse tells you at your preoperative visit.   If you have my chart do not use the time given in my chart use the time given to you by the nurse during your preoperative visit.   Your surgery  time may change. Please be available for phone calls the day of your surgery and the day before. The Short Stay department may need to discuss changes about your surgery time. Not reaching the you could lead to procedure delays and possible cancellation.  You must have a ride home and someone to stay with you for 24 to 48 hours. The person taking you home will receive and sign for the your discharge instructions.  Please be prepared to give your support person's name and telephone number to Central Registration. Dr Margrette will need that name and phone number post procedure.

## 2024-01-15 NOTE — Patient Instructions (Signed)
 Ashley Serrano  01/15/2024     @PREFPERIOPPHARMACY @   Your procedure is scheduled on 01/21/2024.   Report to [facility] at  0600 A.M.  Call this number if you have problems the morning of surgery:  [phone]  If you experience any cold or flu symptoms such as cough, fever, chills, shortness of breath, etc. between now and your scheduled surgery, please notify us  at the above number.   Remember:        DO NOT take any medications for diabetes the morning of your procedure.   Do not eat after midnight.   You may drink clear liquids until  0330 am on 01/21/2024.      Clear liquids allowed are:                    Water, Juice (No red color; non-citric and without pulp; diabetics please choose diet or no sugar options), Carbonated beverages (diabetics please choose diet or no sugar options), Clear Tea (No creamer, milk, or cream, including half & half and powdered creamer), Black Coffee Only (No creamer, milk or cream, including half & half and powdered creamer), and Clear Sports drink (No red color; diabetics please choose diet or no sugar options)    Take these medicines the morning of surgery with A SIP OF WATER                         bupropion , duloxetine , meloxicam  (if needed).    Do not wear jewelry, make-up or nail polish, including gel polish,  artificial nails, or any other type of covering on natural nails (fingers and  toes).  Do not wear lotions, powders, or perfumes, or deodorant.  Do not shave 48 hours prior to surgery.  Men may shave face and neck.  Do not bring valuables to the hospital.  [facility] is not responsible for any belongings or valuables.  Contacts, dentures or bridgework may not be worn into surgery.  Leave your suitcase in the car.  After surgery it may be brought to your room.  For patients admitted to the hospital, discharge time will be determined by your treatment team.  Patients discharged the day of surgery will not be allowed to  drive home and must have someone with them for 24 hours.    Special instructions:   DO NOT smoke tobacco or vape for 24 hours before your procedure.  Please read over the following fact sheets that you were given. Coughing and Deep Breathing, Surgical Site Infection Prevention, Anesthesia Post-op Instructions, and Care and Recovery After Surgery       Open Carpal Tunnel Release: What to Know After After open carpal tunnel release surgery, it's common to have pain, swelling, bruising, and stiffness in the wrist. Follow these instructions at home: Medicines Take your medicines only as told. You may need to take steps to help treat or prevent trouble pooping (constipation), such as: Taking medicines to help you poop. Eating foods high in fiber, like beans, whole grains, and fresh fruits and vegetables. Drinking more fluids as told. If you have a splint or brace that can be taken off: Wear the splint or brace as told. Take it off only if your health care provider says you can. Check the skin around it every day. Tell your provider if you see problems. Loosen the splint or brace if your fingers tingle, are numb, or turn cold and  blue. Keep the splint or brace clean. If the splint or brace isn't waterproof: Do not let it get wet. Cover it when you take a bath or shower. Use a cover that doesn't let any water in. Bathing Do not take baths, swim, or use a hot tub until you're told it's OK. Ask if you can shower. Keep your bandage dry until your provider says it can be removed. Cover it when you take a bath or shower. Use a cover that doesn't let any water in. Wound care  Take care of your cut from surgery as told. Make sure you: Wash your hands with soap and water for at least 20 seconds before and after you change your bandage. If you can't use soap and water, use hand sanitizer. Change your bandage. Leave stitches or skin glue alone. Leave tape strips alone unless you're told to take  them off. You may trim the edges of the tape strips if they curl up. Check the area around your cut from surgery every day for signs of infection. Check for: Redness, swelling, or pain. Fluid or blood. Warmth. Pus or a bad smell. Pain Management  Use ice or an ice pack as told. If you have a splint or brace that you can take off, remove it only as told. Place a towel between your skin and the ice. Leave the ice on for 20 minutes, 2-3 times a day. If your skin turns red, take off the ice right away to prevent skin damage. The risk of damage is higher if you can't feel pain, heat, or cold. Move your fingers often to avoid stiffness and swelling. Raise your wrist above the level of your heart while you're sitting or lying down. Use pillows as needed. Activity Ask when it's safe to drive if you have a splint or brace on your hand. Do not lift with your affected hand until you're told it's OK. Avoid pulling and pushing with the injured arm. Do exercises as told. Ask what things are safe for you to do at home. Ask when you can go back to work or school. General instructions Do not smoke, vape or use nicotine or tobacco. Doing this can slow down healing. Keep all follow-up visits. You'll need to have your hand checked after surgery. Your provider may give you more instructions. Make sure you know what you can and can't do. Contact a health care provider if: You have signs of infection. You have a fever. You have pain that doesn't get better with medicine. Your carpal tunnel symptoms don't go away after 2 months. Your carpal tunnel symptoms go away and then come back. You have numbness that's getting worse. Get help right away if: Your fingers or hand turn cool or pale, or they change color. They may turn blue or grey. You aren't able to move your fingers. You lose feeling in your fingers, hand, or arm. These symptoms may be an emergency. Call 911 right away. Do not wait to see if the  symptoms will go away. Do not drive yourself to the hospital. This information is not intended to replace advice given to you by your health care provider. Make sure you discuss any questions you have with your health care provider. Document Revised: 12/31/2022 Document Reviewed: 12/31/2022 Elsevier Patient Education  2024 [employer] General Anesthesia, Adult, Care After The following information offers guidance on how to care for yourself after your procedure. Your health care provider may also give you more specific instructions.  If you have problems or questions, contact your health care provider. What can I expect after the procedure? After the procedure, it is common for people to: Have pain or discomfort at the IV site. Have nausea or vomiting. Have a sore throat or hoarseness. Have trouble concentrating. Feel cold or chills. Feel weak, sleepy, or tired (fatigue). Have soreness and body aches. These can affect parts of the body that were not involved in surgery. Follow these instructions at home: For the time period you were told by your health care provider:  Rest. Do not participate in activities where you could fall or become injured. Do not drive or use machinery. Do not drink alcohol. Do not take sleeping pills or medicines that cause drowsiness. Do not make important decisions or sign legal documents. Do not take care of children on your own. General instructions Drink enough fluid to keep your urine pale yellow. If you have sleep apnea, surgery and certain medicines can increase your risk for breathing problems. Follow instructions from your health care provider about wearing your sleep device: Anytime you are sleeping, including during daytime naps. While taking prescription pain medicines, sleeping medicines, or medicines that make you drowsy. Return to your normal activities as told by your health care provider. Ask your health care provider what activities are safe  for you. Take over-the-counter and prescription medicines only as told by your health care provider. Do not use any products that contain nicotine or tobacco. These products include cigarettes, chewing tobacco, and vaping devices, such as e-cigarettes. These can delay incision healing after surgery. If you need help quitting, ask your health care provider. Contact a health care provider if: You have nausea or vomiting that does not get better with medicine. You vomit every time you eat or drink. You have pain that does not get better with medicine. You cannot urinate or have bloody urine. You develop a skin rash. You have a fever. Get help right away if: You have trouble breathing. You have chest pain. You vomit blood. These symptoms may be an emergency. Get help right away. Call 911. Do not wait to see if the symptoms will go away. Do not drive yourself to the hospital. Summary After the procedure, it is common to have a sore throat, hoarseness, nausea, vomiting, or to feel weak, sleepy, or fatigue. For the time period you were told by your health care provider, do not drive or use machinery. Get help right away if you have difficulty breathing, have chest pain, or vomit blood. These symptoms may be an emergency. This information is not intended to replace advice given to you by your health care provider. Make sure you discuss any questions you have with your health care provider. Document Revised: 06/30/2021 Document Reviewed: 06/30/2021 Elsevier Patient Education  2024 Else[employer] Inc. How to Use Chlorhexidine at Home in the Shower Chlorhexidine gluconate (CHG) is a germ-killing (antiseptic) wash that's used to clean the skin. It can get rid of the germs that normally live on the skin and can keep them away for about 24 hours. If you're having surgery, you may be told to shower with CHG at home the night before surgery. This can help lower your risk for infection. To use CHG wash in the  shower, follow the steps below. Supplies needed: CHG body wash. Clean washcloth. Clean towel. How to use CHG in the shower Follow these steps unless you're told to use CHG in a different way: Start the shower. Use your normal  soap and shampoo to wash your face and hair. Turn off the shower or move out of the shower stream. Pour CHG onto a clean washcloth. Do not use any type of brush or rough sponge. Start at your neck, washing your body down to your toes. Make sure you: Wash the part of your body where the surgery will be done for at least 1 minute. Do not scrub. Do not use CHG on your head or face unless your health care provider tells you to. If it gets into your ears or eyes, rinse them well with water. Do not wash your genitals with CHG. Wash your back and under your arms. Make sure to wash skin folds. Let the CHG sit on your skin for 1-2 minutes or as long as told. Rinse your entire body in the shower, including all body creases and folds. Turn off the shower. Dry off with a clean towel. Do not put anything on your skin afterward, such as powder, lotion, or perfume. Put on clean clothes or pajamas. If it's the night before surgery, sleep in clean sheets. General tips Use CHG only as told, and follow the instructions on the label. Use the full amount of CHG as told. This is often one bottle. Do not smoke and stay away from flames after using CHG. Your skin may feel sticky after using CHG. This is normal. The sticky feeling will go away as the CHG dries. Do not use CHG: If you have a chlorhexidine allergy or have reacted to chlorhexidine in the past. On open wounds or areas of skin that have broken skin, cuts, or scrapes. On babies younger than 50 months of age. Contact a health care provider if: You have questions about using CHG. Your skin gets irritated or itchy. You have a rash after using CHG. You swallow any CHG. Call your local poison control center [phone] in the  [place] Your eyes itch badly, or they become very red or swollen. Your hearing changes. You have trouble seeing. If you can't reach your provider, go to an urgent care or emergency room. Do not drive yourself. Get help right away if: You have swelling or tingling in your mouth or throat. You make high-pitched whistling sounds when you breathe, most often when you breathe out (wheeze). You have trouble breathing. These symptoms may be an emergency. Call 911 right away. Do not wait to see if the symptoms will go away. Do not drive yourself to the hospital. This information is not intended to replace advice given to you by your health care provider. Make sure you discuss any questions you have with your health care provider. Document Revised: 10/16/2022 Document Reviewed: 10/12/2021 Elsevier Patient Education  2024 Elsevier Inc.How to Use an Incentive Spirometer An incentive spirometer is a tool that measures how well you are filling your lungs with each breath. Learning to take long, deep breaths using this tool can help you keep your lungs clear and active. This may help to reverse or lessen your chance of developing breathing (pulmonary) problems, especially infection. You may be asked to use a spirometer: After a surgery. If you have a lung problem or a history of smoking. After a long period of time when you have been unable to move or be active. If the spirometer includes an indicator to show the highest number that you have reached, your health care provider or respiratory therapist will help you set a goal. Keep a log of your progress as told by your  health care provider. What are the risks? Breathing too quickly may cause dizziness or cause you to pass out. Take your time so you do not get dizzy or light-headed. If you are in pain, you may need to take pain medicine before doing incentive spirometry. It is harder to take a deep breath if you are having pain. How to use your incentive  spirometer  Sit up on the edge of your bed or on a chair. Hold the incentive spirometer so that it is in an upright position. Before you use the spirometer, breathe out normally. Place the mouthpiece in your mouth. Make sure your lips are closed tightly around it. Breathe in slowly and as deeply as you can through your mouth, causing the piston or the ball to rise toward the top of the chamber. Hold your breath for 3-5 seconds, or for as long as possible. If the spirometer includes a coach indicator, use this to guide you in breathing. Slow down your breathing if the indicator goes above the marked areas. Remove the mouthpiece from your mouth and breathe out normally. The piston or ball will return to the bottom of the chamber. Rest for a few seconds, then repeat the steps 10 or more times. Take your time and take a few normal breaths between deep breaths so that you do not get dizzy or light-headed. Do this every 1-2 hours when you are awake. If the spirometer includes a goal marker to show the highest number you have reached (best effort), use this as a goal to work toward during each repetition. After each set of 10 deep breaths, cough a few times. This will help to make sure that your lungs are clear. If you have an incision on your chest or abdomen from surgery, place a pillow or a rolled-up towel firmly against the incision when you cough. This can help to reduce pain while taking deep breaths and coughing. General tips When you are able to get out of bed: Walk around often. Continue to take deep breaths and cough in order to clear your lungs. Keep using the incentive spirometer until your health care provider says it is okay to stop using it. If you have been in the hospital, you may be told to keep using the spirometer at home. Contact a health care provider if: You are having difficulty using the spirometer. You have trouble using the spirometer as often as instructed. Your pain  medicine is not giving enough relief for you to use the spirometer as told. You have a fever. Get help right away if: You develop shortness of breath. You develop a cough with bloody mucus from the lungs. You have fluid or blood coming from an incision site after you cough. Summary An incentive spirometer is a tool that can help you learn to take long, deep breaths to keep your lungs clear and active. You may be asked to use a spirometer after a surgery, if you have a lung problem or a history of smoking, or if you have been inactive for a long period of time. Use your incentive spirometer as instructed every 1-2 hours while you are awake. If you have an incision on your chest or abdomen, place a pillow or a rolled-up towel firmly against your incision when you cough. This will help to reduce pain. Get help right away if you have shortness of breath, you cough up bloody mucus, or blood comes from your incision when you cough. This information is not  intended to replace advice given to you by your health care provider. Make sure you discuss any questions you have with your health care provider. Document Revised: 02/08/2023 Document Reviewed: 02/08/2023 Elsevier Patient Education  2024 [employer].

## 2024-01-16 ENCOUNTER — Encounter (HOSPITAL_COMMUNITY)
Admission: RE | Admit: 2024-01-16 | Discharge: 2024-01-16 | Disposition: A | Discharge status: Home / Self Care | Source: Ambulatory Visit | Attending: Orthopedic Surgery | Admitting: Orthopedic Surgery

## 2024-01-16 ENCOUNTER — Other Ambulatory Visit: Payer: Self-pay

## 2024-01-16 ENCOUNTER — Encounter (HOSPITAL_COMMUNITY): Payer: Self-pay

## 2024-01-16 VITALS — BP 164/79 | HR 90 | Resp 18 | Ht 64.0 in | Wt 215.0 lb

## 2024-01-16 DIAGNOSIS — G5602 Carpal tunnel syndrome, left upper limb: Secondary | ICD-10-CM | POA: Insufficient documentation

## 2024-01-16 DIAGNOSIS — Z01818 Encounter for other preprocedural examination: Secondary | ICD-10-CM | POA: Insufficient documentation

## 2024-01-16 DIAGNOSIS — E119 Type 2 diabetes mellitus without complications: Secondary | ICD-10-CM | POA: Diagnosis not present

## 2024-01-16 HISTORY — DX: Cardiac murmur, unspecified: R01.1

## 2024-01-16 LAB — CBC WITH DIFFERENTIAL/PLATELET
Abs Immature Granulocytes: 0.04 K/uL (ref 0.00–0.07)
Basophils Absolute: 0.1 K/uL (ref 0.0–0.1)
Basophils Relative: 1 %
Eosinophils Absolute: 0.1 K/uL (ref 0.0–0.5)
Eosinophils Relative: 2 %
HCT: 43.5 % (ref 36.0–46.0)
Hemoglobin: 13.8 g/dL (ref 12.0–15.0)
Immature Granulocytes: 0 %
Lymphocytes Relative: 33 %
Lymphs Abs: 3 K/uL (ref 0.7–4.0)
MCH: 28.6 pg (ref 26.0–34.0)
MCHC: 31.7 g/dL (ref 30.0–36.0)
MCV: 90.1 fL (ref 80.0–100.0)
Monocytes Absolute: 0.7 K/uL (ref 0.1–1.0)
Monocytes Relative: 8 %
Neutro Abs: 5.2 K/uL (ref 1.7–7.7)
Neutrophils Relative %: 56 %
Platelets: 284 K/uL (ref 150–400)
RBC: 4.83 MIL/uL (ref 3.87–5.11)
RDW: 16.3 % — ABNORMAL HIGH (ref 11.5–15.5)
WBC: 9.1 K/uL (ref 4.0–10.5)
nRBC: 0 % (ref 0.0–0.2)

## 2024-01-16 LAB — BASIC METABOLIC PANEL WITH GFR
Anion gap: 14 (ref 5–15)
BUN: 16 mg/dL (ref 8–23)
CO2: 24 mmol/L (ref 22–32)
Calcium: 10 mg/dL (ref 8.9–10.3)
Chloride: 102 mmol/L (ref 98–111)
Creatinine, Ser: 0.89 mg/dL (ref 0.44–1.00)
GFR, Estimated: >60 mL/min (ref >60)
Glucose, Bld: 83 mg/dL (ref 70–99)
Potassium: 4.5 mmol/L (ref 3.5–5.1)
Sodium: 139 mmol/L (ref 135–145)

## 2024-01-16 LAB — HEMOGLOBIN A1C
Hgb A1c MFr Bld: 6.2 % — ABNORMAL HIGH (ref 4.8–5.6)
Mean Plasma Glucose: 131.24 mg/dL

## 2024-01-16 NOTE — Progress Notes (Signed)
 Patient here for PAT visit.  Labs and EKG completed.  Patient educated on pre and post procedure.

## 2024-01-20 NOTE — H&P (Signed)
 Office Visit Note              Patient: Ashley Serrano                                       Date of Birth: 1955/04/18                                                 MRN: [id number] Visit Date: 01/08/2024 Requested by: Terry Wilhelmena Lloyd Hilario, FNP [phone] [address] [place],  [place] [address] PCP: Terry Wilhelmena Lloyd Hilario, FNP   Subjective:     Chief Complaint  Patient presents with   Wrist Pain      Left - Carpal Tunnel here to discuss surgery      HPI: 67 year old female presents with carpal tunnel like syndrome left upper extremity with positive nerve conduction study with severe demyelination.  She was previously seen by Dr. Brenna her symptoms were so severe she went for immediate nerve conduction studies   She has severe demyelination   Her index thumb ring finger are numb from the MCP joints distally the thumb is also numb.  She has difficulty picking things up with her left hand and she is left-hand dominant                                                                         ROS: Trigger fingers long and ring previous injections currently stable   Assessment & Plan:   Images personally read and my interpretation : No imaging   Visit Diagnoses:  1. Carpal tunnel syndrome, left upper limb       Plan: Carpal tunnel release left upper extremity   We discussed that she may not get full recovery secondary to the demyelination We discussed pillar pain and wound issues that can be related to surgery       PLAN:    Open left carpal tunnel release       Follow-Up Instructions: Return for POST OP 15 days after surgery.    Orders:  No orders of the defined types were placed in this encounter.         Objective: Vital Signs: BP (!) 174/79   Pulse 90   Ht 5' 4 (1.626 m)   Wt 215 lb (97.5 kg)   BMI 36.90 kg/m            Allergies       Allergies  Allergen Reactions   Wool Alcohol [Lanolin] Hives      Causing swelling and rash    '        Current Outpatient Medications  Medication Instructions   acetaminophen (TYLENOL) 650 mg, Every 8 hours PRN   buPROPion  (WELLBUTRIN  XL) 300 mg, Oral, Daily   cetirizine  (ZYRTEC ) 10 mg, Oral, Daily   cyanocobalamin (VITAMIN B12) 1,000 mcg, Daily   DULoxetine  (CYMBALTA ) 30 MG capsule Oral, Daily   lisinopril  (ZESTRIL ) 2.5 mg, Oral, Daily   meloxicam  (MOBIC ) 7.5  mg, Oral, Once PRN   metFORMIN  (GLUCOPHAGE ) 500 MG tablet TAKE 1 TABLET (500 MG TOTAL) BY MOUTH DAILY. PLEASE TAKE DAILY WITH A MEAL   OVER THE COUNTER MEDICATION Vit D 25 mcg daily.   rosuvastatin  (CRESTOR ) 40 mg, Oral, Daily            Past Medical History:  Diagnosis Date   Arthritis     Depression     Diabetes mellitus without complication (HCC)     Multiple gastric ulcers     Seasonal allergies                 Past Surgical History:  Procedure Laterality Date   ABDOMINAL HYSTERECTOMY       APPENDECTOMY       CHOLECYSTECTOMY       COLONOSCOPY WITH PROPOFOL  N/A 02/26/2023    Procedure: COLONOSCOPY WITH PROPOFOL ;  Surgeon: Eartha Angelia Sieving, MD;  Location: [facility];  Service: Gastroenterology;  Laterality: N/A;  10:45AM;ASA 1   HERNIA REPAIR       KNEE SURGERY Left     POLYPECTOMY   02/26/2023    Procedure: POLYPECTOMY;  Surgeon: Eartha Angelia, Sieving, MD;  Location: [facility];  Service: Gastroenterology;;               Family History  Problem Relation Age of Onset   Cancer Mother     Thyroid disease Mother     Hypertension Father     Alcoholism Sister     Seizures Sister     Arthritis Sister     Cancer Brother     Arthritis Brother     Ulcers Maternal Grandmother              Social History  Social History         Tobacco Use   Smoking status: Every Day      Current packs/day: 1.00      Types: Cigarettes   Smokeless tobacco: Never  Vaping Use   Vaping status: Never Used  Substance Use Topics   Drug use: Never        Allergies       Allergies  Allergen  Reactions   Wool Alcohol [Lanolin] Hives      Causing swelling and rash              Current Outpatient Medications  Medication Sig Dispense Refill   acetaminophen (TYLENOL) 650 MG CR tablet Take 650 mg by mouth every 8 (eight) hours as needed for pain.       buPROPion  (WELLBUTRIN  XL) 300 MG 24 hr tablet TAKE 1 TABLET BY MOUTH EVERY DAY 90 tablet 2   cetirizine  (ZYRTEC ) 10 MG tablet Take 1 tablet (10 mg total) by mouth daily. 30 tablet 0   cyanocobalamin (VITAMIN B12) 1000 MCG tablet Take 1,000 mcg by mouth daily.       DULoxetine  (CYMBALTA ) 30 MG capsule TAKE 1 CAPSULE BY MOUTH EVERY DAY 90 capsule 2   lisinopril  (ZESTRIL ) 2.5 MG tablet Take 1 tablet (2.5 mg total) by mouth daily. 30 tablet 2   meloxicam  (MOBIC ) 7.5 MG tablet TAKE 1 TABLET (7.5 MG TOTAL) BY MOUTH ONCE AS NEEDED FOR UP TO 1 DOSE FOR PAIN. 30 tablet 1   metFORMIN  (GLUCOPHAGE ) 500 MG tablet TAKE 1 TABLET (500 MG TOTAL) BY MOUTH DAILY. PLEASE TAKE DAILY WITH A MEAL 90 tablet 3   OVER THE COUNTER MEDICATION Vit D 25 mcg daily.  rosuvastatin  (CRESTOR ) 40 MG tablet Take 1 tablet (40 mg total) by mouth daily. 90 tablet 3      No current facility-administered medications for this visit.          Physical Exam BP (!) 174/79   Pulse 90   Ht 5' 4 (1.626 m)   Wt 215 lb (97.5 kg)   BMI 36.90 kg/m    The patient is well developed well nourished and well groomed.  Orientation to person place and time is normal  Mood is pleasant.   Ambulatory status normal gait and stance    Left upper extremity examination reveals the following:   SWELLING no TENDERNESS OVER THE CARPAL TUNNEL yes   Range of motion of the wrist normal   Motor exam normal   Wrist joint is stable   Provocative tests for carpal tunnel Phalen's test positive Carpal tunnel compression test positive Tinel's test negative   Pulses are normal in the radial and ulnar artery with a normal Allen's test.   Decreased sensation in the thumb index  long and ring finger

## 2024-01-21 ENCOUNTER — Ambulatory Visit (HOSPITAL_BASED_OUTPATIENT_CLINIC_OR_DEPARTMENT_OTHER)

## 2024-01-21 ENCOUNTER — Encounter (HOSPITAL_COMMUNITY)
Admission: RE | Disposition: A | Discharge status: Home / Self Care | Payer: Self-pay | Source: Home / Self Care | Attending: Orthopedic Surgery

## 2024-01-21 ENCOUNTER — Ambulatory Visit (HOSPITAL_COMMUNITY)

## 2024-01-21 ENCOUNTER — Ambulatory Visit (HOSPITAL_COMMUNITY)
Admission: RE | Admit: 2024-01-21 | Discharge: 2024-01-21 | Disposition: A | Discharge status: Home / Self Care | Attending: Orthopedic Surgery | Admitting: Orthopedic Surgery

## 2024-01-21 ENCOUNTER — Encounter (HOSPITAL_COMMUNITY): Payer: Self-pay | Admitting: Orthopedic Surgery

## 2024-01-21 DIAGNOSIS — Z8711 Personal history of peptic ulcer disease: Secondary | ICD-10-CM | POA: Diagnosis not present

## 2024-01-21 DIAGNOSIS — E119 Type 2 diabetes mellitus without complications: Secondary | ICD-10-CM | POA: Diagnosis not present

## 2024-01-21 DIAGNOSIS — M1990 Unspecified osteoarthritis, unspecified site: Secondary | ICD-10-CM | POA: Diagnosis not present

## 2024-01-21 DIAGNOSIS — Z7984 Long term (current) use of oral hypoglycemic drugs: Secondary | ICD-10-CM | POA: Diagnosis not present

## 2024-01-21 DIAGNOSIS — E66813 Obesity, class 3: Secondary | ICD-10-CM | POA: Insufficient documentation

## 2024-01-21 DIAGNOSIS — G5602 Carpal tunnel syndrome, left upper limb: Secondary | ICD-10-CM

## 2024-01-21 DIAGNOSIS — F32A Depression, unspecified: Secondary | ICD-10-CM | POA: Diagnosis not present

## 2024-01-21 DIAGNOSIS — Z6836 Body mass index (BMI) 36.0-36.9, adult: Secondary | ICD-10-CM | POA: Insufficient documentation

## 2024-01-21 DIAGNOSIS — R011 Cardiac murmur, unspecified: Secondary | ICD-10-CM | POA: Diagnosis not present

## 2024-01-21 DIAGNOSIS — F17210 Nicotine dependence, cigarettes, uncomplicated: Secondary | ICD-10-CM | POA: Insufficient documentation

## 2024-01-21 HISTORY — PX: CARPAL TUNNEL RELEASE: SHX101

## 2024-01-21 LAB — GLUCOSE, CAPILLARY
Glucose-Capillary: 123 mg/dL — ABNORMAL HIGH (ref 70–99)
Glucose-Capillary: 132 mg/dL — ABNORMAL HIGH (ref 70–99)

## 2024-01-21 SURGERY — CARPAL TUNNEL RELEASE
Anesthesia: General | Site: Hand | Laterality: Left

## 2024-01-21 MED ORDER — OXYCODONE HCL 5 MG PO TABS
5.0000 mg | ORAL_TABLET | Freq: Once | ORAL | Status: AC
  Administered 2024-01-21: 5 mg via ORAL

## 2024-01-21 MED ORDER — ONDANSETRON HCL 4 MG/2ML IJ SOLN
INTRAMUSCULAR | Status: DC | PRN
  Administered 2024-01-21: 4 mg via INTRAVENOUS

## 2024-01-21 MED ORDER — BUPIVACAINE HCL (PF) 0.5 % IJ SOLN
INTRAMUSCULAR | Status: DC | PRN
  Administered 2024-01-21: 10 mL

## 2024-01-21 MED ORDER — 0.9 % SODIUM CHLORIDE (POUR BTL) OPTIME
TOPICAL | Status: DC | PRN
  Administered 2024-01-21: 1000 mL

## 2024-01-21 MED ORDER — FENTANYL CITRATE (PF) 100 MCG/2ML IJ SOLN
INTRAMUSCULAR | Status: AC
  Filled 2024-01-21: qty 2

## 2024-01-21 MED ORDER — CHLORHEXIDINE GLUCONATE 0.12 % MT SOLN
15.0000 mL | Freq: Once | OROMUCOSAL | Status: AC
  Administered 2024-01-21: 15 mL via OROMUCOSAL

## 2024-01-21 MED ORDER — CEFAZOLIN SODIUM-DEXTROSE 2-4 GM/100ML-% IV SOLN
2.0000 g | INTRAVENOUS | Status: AC
Start: 2024-01-21 — End: 2024-01-21
  Administered 2024-01-21: 2 g via INTRAVENOUS
  Filled 2024-01-21: qty 100

## 2024-01-21 MED ORDER — OXYCODONE HCL 5 MG PO TABS
ORAL_TABLET | ORAL | Status: AC
  Filled 2024-01-21: qty 1

## 2024-01-21 MED ORDER — BUPIVACAINE HCL (PF) 0.5 % IJ SOLN
INTRAMUSCULAR | Status: AC
  Filled 2024-01-21: qty 30

## 2024-01-21 MED ORDER — FENTANYL CITRATE (PF) 100 MCG/2ML IJ SOLN
INTRAMUSCULAR | Status: DC | PRN
  Administered 2024-01-21 (×2): 50 ug via INTRAVENOUS

## 2024-01-21 MED ORDER — LIDOCAINE 2% (20 MG/ML) 5 ML SYRINGE
INTRAMUSCULAR | Status: AC
  Filled 2024-01-21: qty 5

## 2024-01-21 MED ORDER — ORAL CARE MOUTH RINSE: 15.0000 mL | Freq: Once | OROMUCOSAL | Status: AC

## 2024-01-21 MED ORDER — LACTATED RINGERS IV SOLN: INTRAVENOUS | Status: DC

## 2024-01-21 MED ORDER — LIDOCAINE 2% (20 MG/ML) 5 ML SYRINGE
INTRAMUSCULAR | Status: DC | PRN
  Administered 2024-01-21: 60 mg via INTRAVENOUS

## 2024-01-21 MED ORDER — ACETAMINOPHEN-CODEINE 300-30 MG PO TABS: 1.0000 | ORAL_TABLET | Freq: Four times a day (QID) | ORAL | 0 refills | Status: AC | PRN

## 2024-01-21 MED ORDER — PROPOFOL 10 MG/ML IV BOLUS
INTRAVENOUS | Status: AC
  Filled 2024-01-21: qty 40

## 2024-01-21 MED ORDER — PROPOFOL 10 MG/ML IV BOLUS
INTRAVENOUS | Status: DC | PRN
  Administered 2024-01-21 (×2): 50 mg via INTRAVENOUS
  Administered 2024-01-21: 200 mg via INTRAVENOUS

## 2024-01-21 SURGICAL SUPPLY — 32 items
BANDAGE ESMARK 4X12 BL STRL LF (DISPOSABLE) ×1 IMPLANT
BLADE SURG 15 STRL LF DISP TIS (BLADE) ×1 IMPLANT
BNDG COHESIVE 4X5 TAN STRL LF (GAUZE/BANDAGES/DRESSINGS) IMPLANT
BNDG ELASTIC 2 VLCR STRL LF (GAUZE/BANDAGES/DRESSINGS) ×1 IMPLANT
BNDG ELASTIC 3X5.8 VLCR NS LF (GAUZE/BANDAGES/DRESSINGS) ×1 IMPLANT
BNDG GAUZE DERMACEA FLUFF 4 (GAUZE/BANDAGES/DRESSINGS) IMPLANT
CHLORAPREP W/TINT 26 (MISCELLANEOUS) ×1 IMPLANT
CLOTH BEACON ORANGE TIMEOUT ST (SAFETY) ×1 IMPLANT
COVER LIGHT HANDLE STERIS (MISCELLANEOUS) ×2 IMPLANT
CUFF TOURN SGL QUICK 18X4 (TOURNIQUET CUFF) ×1 IMPLANT
DRAPE HALF SHEET 40X57 (DRAPES) ×1 IMPLANT
ELECTRODE REM PT RTRN 9FT ADLT (ELECTROSURGICAL) ×1 IMPLANT
GAUZE SPONGE 4X4 12PLY STRL (GAUZE/BANDAGES/DRESSINGS) ×1 IMPLANT
GAUZE XEROFORM 1X8 LF (GAUZE/BANDAGES/DRESSINGS) ×1 IMPLANT
GLOVE BIOGEL PI IND STRL 7.0 (GLOVE) ×2 IMPLANT
GLOVE BIOGEL PI IND STRL 8.5 (GLOVE) ×1 IMPLANT
GLOVE SKINSENSE STRL SZ8.0 LF (GLOVE) ×1 IMPLANT
GLOVE SURG SS PI 7.0 STRL IVOR (GLOVE) IMPLANT
GOWN STRL REUS W/TWL LRG LVL3 (GOWN DISPOSABLE) ×1 IMPLANT
GOWN STRL REUS W/TWL XL LVL3 (GOWN DISPOSABLE) ×1 IMPLANT
KIT TURNOVER KIT A (KITS) ×1 IMPLANT
MANIFOLD NEPTUNE II (INSTRUMENTS) ×1 IMPLANT
NDL HYPO 21X1.5 SAFETY (NEEDLE) ×1 IMPLANT
NEEDLE HYPO 21X1.5 SAFETY (NEEDLE) ×1 IMPLANT
NS IRRIG 1000ML POUR BTL (IV SOLUTION) ×1 IMPLANT
PACK BASIC LIMB (CUSTOM PROCEDURE TRAY) ×1 IMPLANT
PAD ARMBOARD POSITIONER FOAM (MISCELLANEOUS) ×1 IMPLANT
POSITIONER HAND ALUMI XLG (MISCELLANEOUS) ×1 IMPLANT
POSITIONER HEAD 8X9X4 ADT (SOFTGOODS) ×1 IMPLANT
SET BASIN LINEN APH (SET/KITS/TRAYS/PACK) ×1 IMPLANT
SUT 3-0 BLK 1X30 PSL (SUTURE) ×1 IMPLANT
SYR CONTROL 10ML LL (SYRINGE) ×1 IMPLANT

## 2024-01-21 NOTE — Transfer of Care (Signed)
 Immediate Anesthesia Transfer of Care Note  Patient: ZAHRIA DING  Procedure(s) Performed: CARPAL TUNNEL RELEASE (Left: Hand)  Patient Location: PACU  Anesthesia Type:General  Level of Consciousness: drowsy  Airway & Oxygen Therapy: Patient Spontanous Breathing and Patient connected to face mask oxygen  Post-op Assessment: Report given to RN and Post -op Vital signs reviewed and stable  Post vital signs: Reviewed and stable  Last Vitals:  Vitals Value Taken Time  BP 150/78 01/21/24 08:10  Temp 98.2 01/21/24  0812  Pulse 70 01/21/24 08:11  Resp 25 01/21/24 08:11  SpO2 98 % 01/21/24 08:11  Vitals shown include unfiled device data.  Last Pain:  Vitals:   01/21/24 0637  PainSc: 0-No pain         Complications: No notable events documented.

## 2024-01-21 NOTE — Op Note (Signed)
 01/21/2024  8:06 AM  PATIENT:  Ashley Serrano  68 y.o. female  PRE-OPERATIVE DIAGNOSIS:  left carpal tunnel syndrome  POST-OPERATIVE DIAGNOSIS:  left carpal tunnel syndrome  PROCEDURE:  Procedure(s): CARPAL TUNNEL RELEASE (Left)  SURGEON:  Surgeons and Role:    DEWAINE Margrette Taft FORBES, MD - Primary  PHYSICIAN ASSISTANT:   ASSISTANTS: NANDAN PATEL   ANESTHESIA:   general  EBL:  0 mL   BLOOD ADMINISTERED:none  DRAINS: none   LOCAL MEDICATIONS USED:  MARCAINE     SPECIMEN:  No Specimen  DISPOSITION OF SPECIMEN:  N/A  COUNTS:  YES  TOURNIQUET:   Total Tourniquet Time Documented: Upper Arm (Left) - 15 minutes Total: Upper Arm (Left) - 15 minutes   DICTATION: .Nechama Dictation  PLAN OF CARE: Discharge to home after PACU  PATIENT DISPOSITION:  PACU - hemodynamically stable.   Delay start of Pharmacological VTE agent (>24hrs) due to surgical blood loss or risk of bleeding: not applicable   Operative findings compression of the LEFT median nerve POOR COLOR,  FLAT SHAPE   Indications failure of conservative treatment to relieve pain and paresthesias and numbness and tingling of the left hand  The patient was identified in the preop area we confirm the surgical site marked as left wrist chart update completed. Patient taken to surgery.  Antibiotic ANCEF  after establishing a successful anesthesia WITH LMA the   left arm was prepped AND DRAPED STERILE  Timeout executed completed and confirmed site.  Left wrist  The limb was exsanguinated with the 4 [facility]  A straight incision was made over the left carpal tunnel in line with the radial border of the ring finger. Blunt dissection was carried out to find the distal aspect of the carpal tunnel. A blunted instrument was passed beneath the carpal tunnel. Sharp incision was then used to release the transverse carpal ligament. The contents of the carpal tunnel were inspected. The median nerve was flattened and  discolored  The wound was irrigated and then closed with 3-0 nylon suture. We injected 10 mL of plain Marcaine on the radial side of the incision  A sterile bandage was applied and the tourniquet was released the color of the hand and capillary refill were normal  The patient was taken to the recovery room in stable condition  PLAN OF CARE: Discharge to home after PACU  PATIENT DISPOSITION:  PACU - hemodynamically stable.   Delay start of Pharmacological VTE agent (>24hrs) due to surgical blood loss or risk of bleeding: not applicable

## 2024-01-21 NOTE — Anesthesia Procedure Notes (Signed)
 Procedure Name: LMA Insertion Date/Time: 01/21/2024 7:31 AM  Performed by: Barbarann Verneita RAMAN, CRNAPre-anesthesia Checklist: Patient identified, Patient being monitored, Timeout performed, Emergency Drugs available and Suction available Patient Re-evaluated:Patient Re-evaluated prior to induction Oxygen Delivery Method: Circle System Utilized Preoxygenation: Pre-oxygenation with 100% oxygen Induction Type: IV induction Ventilation: Mask ventilation without difficulty LMA: LMA inserted LMA Size: 4.0 Number of attempts: 1 Placement Confirmation: positive ETCO2 and breath sounds checked- equal and bilateral Tube secured with: Tape Dental Injury: Teeth and Oropharynx as per pre-operative assessment

## 2024-01-21 NOTE — Brief Op Note (Signed)
 01/21/2024  8:06 AM  PATIENT:  Ashley Serrano  68 y.o. female  PRE-OPERATIVE DIAGNOSIS:  left carpal tunnel syndrome  POST-OPERATIVE DIAGNOSIS:  left carpal tunnel syndrome  PROCEDURE:  Procedure(s): CARPAL TUNNEL RELEASE (Left)  SURGEON:  Surgeons and Role:    DEWAINE Margrette Taft FORBES, MD - Primary  PHYSICIAN ASSISTANT:   ASSISTANTS: NANDAN PATEL   ANESTHESIA:   general  EBL:  0 mL   BLOOD ADMINISTERED:none  DRAINS: none   LOCAL MEDICATIONS USED:  MARCAINE     SPECIMEN:  No Specimen  DISPOSITION OF SPECIMEN:  N/A  COUNTS:  YES  TOURNIQUET:   Total Tourniquet Time Documented: Upper Arm (Left) - 15 minutes Total: Upper Arm (Left) - 15 minutes   DICTATION: .Nechama Dictation  PLAN OF CARE: Discharge to home after PACU  PATIENT DISPOSITION:  PACU - hemodynamically stable.   Delay start of Pharmacological VTE agent (>24hrs) due to surgical blood loss or risk of bleeding: not applicable   Operative findings compression of the LEFT median nerve POOR COLOR,  FLAT SHAPE   Indications failure of conservative treatment to relieve pain and paresthesias and numbness and tingling of the left hand  The patient was identified in the preop area we confirm the surgical site marked as left wrist chart update completed. Patient taken to surgery.  Antibiotic ANCEF  after establishing a successful anesthesia WITH LMA the   left arm was prepped AND DRAPED STERILE  Timeout executed completed and confirmed site.  Left wrist  The limb was exsanguinated with the 4 [facility]  A straight incision was made over the left carpal tunnel in line with the radial border of the ring finger. Blunt dissection was carried out to find the distal aspect of the carpal tunnel. A blunted instrument was passed beneath the carpal tunnel. Sharp incision was then used to release the transverse carpal ligament. The contents of the carpal tunnel were inspected. The median nerve was flattened and  discolored  The wound was irrigated and then closed with 3-0 nylon suture. We injected 10 mL of plain Marcaine on the radial side of the incision  A sterile bandage was applied and the tourniquet was released the color of the hand and capillary refill were normal  The patient was taken to the recovery room in stable condition  PLAN OF CARE: Discharge to home after PACU  PATIENT DISPOSITION:  PACU - hemodynamically stable.   Delay start of Pharmacological VTE agent (>24hrs) due to surgical blood loss or risk of bleeding: not applicable

## 2024-01-21 NOTE — Interval H&P Note (Signed)
 History and Physical Interval Note:  01/21/2024 7:19 AM  Ashley Serrano  has presented today for surgery, with the diagnosis of left carpal tunnel syndrome.  The various methods of treatment have been discussed with the patient and family. After consideration of risks, benefits and other options for treatment, the patient has consented to  Procedure(s): CARPAL TUNNEL RELEASE (Left) as a surgical intervention.  The patient's history has been reviewed, patient examined, no change in status, stable for surgery.  I have reviewed the patient's chart and labs.  Questions were answered to the patient's satisfaction.     [name]

## 2024-01-21 NOTE — Anesthesia Postprocedure Evaluation (Signed)
 Anesthesia Post Note  Patient: Ashley Serrano  Procedure(s) Performed: CARPAL TUNNEL RELEASE (Left: Hand)  Patient location during evaluation: PACU Anesthesia Type: General Level of consciousness: awake and alert Pain management: pain level controlled Vital Signs Assessment: post-procedure vital signs reviewed and stable Respiratory status: spontaneous breathing, nonlabored ventilation, respiratory function stable and patient connected to nasal cannula oxygen Cardiovascular status: blood pressure returned to baseline and stable Postop Assessment: no apparent nausea or vomiting Anesthetic complications: no   No notable events documented.   Last Vitals:  Vitals:   01/21/24 0845 01/21/24 0853  BP:  (!) 156/81  Pulse: 73 72  Resp: 17 16  Temp:  36.7 C  SpO2: 93% 99%    Last Pain:  Vitals:   01/21/24 0853  TempSrc: Oral  PainSc: 4                  Andrea Limes

## 2024-01-21 NOTE — Interval H&P Note (Signed)
 History and Physical Interval Note:  01/21/2024 7:20 AM  Ashley Serrano  has presented today for surgery, with the diagnosis of left carpal tunnel syndrome.  The various methods of treatment have been discussed with the patient and family. After consideration of risks, benefits and other options for treatment, the patient has consented to  Procedure(s): CARPAL TUNNEL RELEASE (Left) as a surgical intervention.  The patient's history has been reviewed, patient examined, no change in status, stable for surgery.  I have reviewed the patient's chart and labs.  Questions were answered to the patient's satisfaction.     [name]

## 2024-01-21 NOTE — Anesthesia Preprocedure Evaluation (Signed)
 Anesthesia Evaluation  Patient identified by MRN, date of birth, ID band Patient awake    Reviewed: Allergy & Precautions, H&P , NPO status , Patient's Chart, lab work & pertinent test results  Airway Mallampati: II  TM Distance: >3 FB Neck ROM: Full    Dental   Pulmonary shortness of breath, Current Smoker and Patient abstained from smoking.   Pulmonary exam normal breath sounds clear to auscultation       Cardiovascular Normal cardiovascular exam+ Valvular Problems/Murmurs  Rhythm:Regular Rate:Normal     Neuro/Psych  PSYCHIATRIC DISORDERS  Depression     Neuromuscular disease    GI/Hepatic Neg liver ROS, PUD,,,  Endo/Other  diabetes, Oral Hypoglycemic Agents  Class 3 obesity  Renal/GU negative Renal ROS  negative genitourinary   Musculoskeletal  (+) Arthritis ,    Abdominal   Peds negative pediatric ROS (+)  Hematology negative hematology ROS (+)   Anesthesia Other Findings   Reproductive/Obstetrics negative OB ROS                              Anesthesia Physical Anesthesia Plan  ASA: 3  Anesthesia Plan: General   Post-op Pain Management:    Induction: Intravenous  PONV Risk Score and Plan:   Airway Management Planned: Oral ETT and LMA  Additional Equipment:   Intra-op Plan:   Post-operative Plan: Extubation in OR  Informed Consent: I have reviewed the patients History and Physical, chart, labs and discussed the procedure including the risks, benefits and alternatives for the proposed anesthesia with the patient or authorized representative who has indicated his/her understanding and acceptance.     Dental advisory given  Plan Discussed with: CRNA  Anesthesia Plan Comments:         Anesthesia Quick Evaluation

## 2024-01-30 ENCOUNTER — Encounter (INDEPENDENT_AMBULATORY_CARE_PROVIDER_SITE_OTHER): Payer: Self-pay | Admitting: *Deleted

## 2024-02-05 ENCOUNTER — Ambulatory Visit: Admitting: Orthopedic Surgery

## 2024-02-05 ENCOUNTER — Encounter: Payer: Self-pay | Admitting: Orthopedic Surgery

## 2024-02-05 DIAGNOSIS — G5602 Carpal tunnel syndrome, left upper limb: Secondary | ICD-10-CM

## 2024-02-05 NOTE — Progress Notes (Signed)
    02/05/2024   Chief Complaint  Patient presents with   Post-op Follow-up    Encounter Diagnosis  Name Primary?   Carpal tunnel syndrome, left upper limb s/p release 01/21/24 Yes    What pharmacy do you use ? [facility] [place]________  DOI/DOS/ Date:    Improved

## 2024-02-05 NOTE — Progress Notes (Signed)
    02/05/2024   Chief Complaint  Patient presents with   Post-op Follow-up    Encounter Diagnosis  Name Primary?   Carpal tunnel syndrome, left upper limb s/p release 01/21/24 Yes    What pharmacy do you use ? [facility] [place]  DOI/DOS/ Date:    Improved   Doing well after carpal tunnel release sutures came out nice and easy  Ring finger complete relief thumb index and long partial relief  Picture of incision is in the media section  Patient can almost make full range of motion with the left hand  Work on flexion extension  Return to work in 2 weeks  Return to clinic in 4

## 2024-02-19 ENCOUNTER — Other Ambulatory Visit: Payer: Self-pay | Admitting: Family Medicine

## 2024-02-20 ENCOUNTER — Encounter (INDEPENDENT_AMBULATORY_CARE_PROVIDER_SITE_OTHER): Payer: Self-pay | Admitting: Gastroenterology

## 2024-02-23 ENCOUNTER — Other Ambulatory Visit: Payer: Self-pay | Admitting: Family Medicine

## 2024-03-04 ENCOUNTER — Ambulatory Visit (INDEPENDENT_AMBULATORY_CARE_PROVIDER_SITE_OTHER): Admitting: Orthopedic Surgery

## 2024-03-04 ENCOUNTER — Encounter: Payer: Self-pay | Admitting: Orthopedic Surgery

## 2024-03-04 DIAGNOSIS — G5602 Carpal tunnel syndrome, left upper limb: Secondary | ICD-10-CM

## 2024-03-04 NOTE — Progress Notes (Signed)
    03/04/2024   Chief Complaint  Patient presents with   Post-op Follow-up    Left hand 01/21/24 improving s/p CTR    Encounter Diagnosis  Name Primary?   Carpal tunnel syndrome, left upper limb s/p release 01/21/24 Yes    What [facility] ? [facility]  DOI/DOS/ Date: 01/21/24  Did you get better, worse or no change (Answer below)   Improved

## 2024-03-04 NOTE — Progress Notes (Signed)
   POST OP VISIT   Patient: Ashley Serrano           Date of Birth: 1956/04/12           MRN: [id number] Visit Date: 03/04/2024 Requested by: Terry Wilhelmena Lloyd Hilario, FNP [phone] [address] [place],  [place] [address] PCP: Terry Wilhelmena Lloyd Hilario, FNP   Encounter Diagnosis  Name Primary?   Carpal tunnel syndrome, left upper limb s/p release 01/21/24 Yes   PROCEDURE: Left carpal tunnel release  Chief Complaint  Patient presents with   Post-op Follow-up    Left hand 01/21/24 improving s/p CTR    Allergies  Allergen Reactions   Wool Alcohol [Lanolin] Hives    Causing swelling and rash    Exam findings:  The wound healed up nicely no problems there she can make a full fist she is not having any night pain  She does have some residual tingling at the fingertips  This should resolve over time  She is happy with the result Return as needed

## 2024-03-19 ENCOUNTER — Ambulatory Visit: Admitting: Family Medicine

## 2024-04-25 ENCOUNTER — Other Ambulatory Visit: Payer: Self-pay | Admitting: Family Medicine

## 2024-04-25 DIAGNOSIS — M25551 Pain in right hip: Secondary | ICD-10-CM

## 2024-04-30 NOTE — Progress Notes (Signed)
 Ashley Serrano                                          MRN: [id number]   04/30/2024   The [facility] Quality Team Specialist reviewed this patient medical record for the purposes of chart review for care gap closure. The following were reviewed: chart review for care gap closure-kidney health evaluation for diabetes:eGFR  and uACR.    [facility] Quality Team

## 2024-05-13 ENCOUNTER — Ambulatory Visit

## 2024-05-17 ENCOUNTER — Other Ambulatory Visit: Payer: Self-pay | Admitting: Family Medicine

## 2024-05-19 ENCOUNTER — Ambulatory Visit

## 2024-06-17 ENCOUNTER — Ambulatory Visit: Admitting: Nurse Practitioner
